# Patient Record
Sex: Female | Born: 1960 | Race: White | Hispanic: No | Marital: Married | Smoking: Current every day smoker
Health system: Southern US, Community
[De-identification: ages and names within clinical notes are randomized; demographics above are authoritative.]

## PROBLEM LIST (undated history)

## (undated) DIAGNOSIS — F319 Bipolar disorder, unspecified: Secondary | ICD-10-CM

## (undated) HISTORY — PX: ECTOPIC PREGNANCY SURGERY: SHX613

---

## 2003-04-18 ENCOUNTER — Emergency Department (HOSPITAL_COMMUNITY): Admission: EM | Admit: 2003-04-18 | Discharge: 2003-04-18 | Payer: Self-pay | Admitting: Emergency Medicine

## 2003-04-18 ENCOUNTER — Encounter: Payer: Self-pay | Admitting: Emergency Medicine

## 2004-03-29 ENCOUNTER — Emergency Department (HOSPITAL_COMMUNITY): Admission: EM | Admit: 2004-03-29 | Discharge: 2004-03-29 | Payer: Self-pay | Admitting: Emergency Medicine

## 2004-04-29 ENCOUNTER — Encounter: Admission: RE | Admit: 2004-04-29 | Discharge: 2004-04-29 | Payer: Self-pay | Admitting: Family Medicine

## 2004-06-22 ENCOUNTER — Emergency Department (HOSPITAL_COMMUNITY): Admission: EM | Admit: 2004-06-22 | Discharge: 2004-06-22 | Payer: Self-pay | Admitting: Emergency Medicine

## 2004-06-23 ENCOUNTER — Emergency Department (HOSPITAL_COMMUNITY): Admission: EM | Admit: 2004-06-23 | Discharge: 2004-06-23 | Payer: Self-pay | Admitting: *Deleted

## 2004-09-06 ENCOUNTER — Ambulatory Visit: Payer: Self-pay | Admitting: Internal Medicine

## 2004-11-27 ENCOUNTER — Ambulatory Visit: Payer: Self-pay | Admitting: Family Medicine

## 2005-02-04 ENCOUNTER — Ambulatory Visit: Payer: Self-pay | Admitting: Family Medicine

## 2005-02-10 ENCOUNTER — Ambulatory Visit: Payer: Self-pay | Admitting: Family Medicine

## 2005-02-18 ENCOUNTER — Ambulatory Visit: Payer: Self-pay | Admitting: Family Medicine

## 2005-02-24 ENCOUNTER — Ambulatory Visit: Payer: Self-pay | Admitting: Family Medicine

## 2005-03-12 ENCOUNTER — Ambulatory Visit: Payer: Self-pay | Admitting: Family Medicine

## 2005-03-27 ENCOUNTER — Ambulatory Visit: Payer: Self-pay | Admitting: Family Medicine

## 2005-04-15 ENCOUNTER — Ambulatory Visit: Payer: Self-pay | Admitting: Family Medicine

## 2005-05-20 ENCOUNTER — Ambulatory Visit: Payer: Self-pay | Admitting: Family Medicine

## 2005-10-22 ENCOUNTER — Ambulatory Visit: Payer: Self-pay | Admitting: Family Medicine

## 2006-01-06 ENCOUNTER — Ambulatory Visit: Payer: Self-pay | Admitting: Internal Medicine

## 2006-02-04 ENCOUNTER — Ambulatory Visit: Payer: Self-pay | Admitting: Family Medicine

## 2006-02-04 ENCOUNTER — Encounter (INDEPENDENT_AMBULATORY_CARE_PROVIDER_SITE_OTHER): Payer: Self-pay | Admitting: Internal Medicine

## 2006-02-04 LAB — CONVERTED CEMR LAB
Creatinine, Ser: 0.7 mg/dL
Lithium Lvl: <0.25 meq/L

## 2006-03-05 ENCOUNTER — Ambulatory Visit: Payer: Self-pay | Admitting: Family Medicine

## 2007-05-03 ENCOUNTER — Encounter (INDEPENDENT_AMBULATORY_CARE_PROVIDER_SITE_OTHER): Payer: Self-pay | Admitting: Internal Medicine

## 2007-05-03 DIAGNOSIS — J309 Allergic rhinitis, unspecified: Secondary | ICD-10-CM | POA: Insufficient documentation

## 2007-05-03 DIAGNOSIS — O009 Unspecified ectopic pregnancy without intrauterine pregnancy: Secondary | ICD-10-CM

## 2007-05-03 DIAGNOSIS — F429 Obsessive-compulsive disorder, unspecified: Secondary | ICD-10-CM | POA: Insufficient documentation

## 2007-05-03 DIAGNOSIS — G2581 Restless legs syndrome: Secondary | ICD-10-CM

## 2007-05-03 DIAGNOSIS — F519 Sleep disorder not due to a substance or known physiological condition, unspecified: Secondary | ICD-10-CM | POA: Insufficient documentation

## 2007-05-03 DIAGNOSIS — F319 Bipolar disorder, unspecified: Secondary | ICD-10-CM | POA: Insufficient documentation

## 2007-05-03 DIAGNOSIS — G43909 Migraine, unspecified, not intractable, without status migrainosus: Secondary | ICD-10-CM | POA: Insufficient documentation

## 2007-05-03 LAB — CONVERTED CEMR LAB: MCV: 92.2 fL

## 2007-05-17 ENCOUNTER — Ambulatory Visit: Payer: Self-pay | Admitting: Family Medicine

## 2007-05-17 ENCOUNTER — Encounter (INDEPENDENT_AMBULATORY_CARE_PROVIDER_SITE_OTHER): Payer: Self-pay | Admitting: Internal Medicine

## 2007-05-17 LAB — CONVERTED CEMR LAB: Valproic Acid Lvl: 95.6 ug/mL

## 2008-06-05 ENCOUNTER — Telehealth (INDEPENDENT_AMBULATORY_CARE_PROVIDER_SITE_OTHER): Payer: Self-pay | Admitting: Family Medicine

## 2008-06-06 ENCOUNTER — Ambulatory Visit: Payer: Self-pay | Admitting: Family Medicine

## 2008-06-06 DIAGNOSIS — J069 Acute upper respiratory infection, unspecified: Secondary | ICD-10-CM | POA: Insufficient documentation

## 2008-07-24 ENCOUNTER — Encounter (INDEPENDENT_AMBULATORY_CARE_PROVIDER_SITE_OTHER): Payer: Self-pay | Admitting: Internal Medicine

## 2008-11-06 ENCOUNTER — Telehealth (INDEPENDENT_AMBULATORY_CARE_PROVIDER_SITE_OTHER): Payer: Self-pay | Admitting: *Deleted

## 2008-11-13 ENCOUNTER — Ambulatory Visit: Payer: Self-pay | Admitting: Family Medicine

## 2008-11-13 DIAGNOSIS — K219 Gastro-esophageal reflux disease without esophagitis: Secondary | ICD-10-CM

## 2009-01-31 ENCOUNTER — Ambulatory Visit: Payer: Self-pay | Admitting: Family Medicine

## 2009-01-31 DIAGNOSIS — G44209 Tension-type headache, unspecified, not intractable: Secondary | ICD-10-CM

## 2009-02-06 LAB — CONVERTED CEMR LAB
ALT: 27 units/L (ref 0–35)
Alkaline Phosphatase: 80 units/L (ref 39–117)
Basophils Absolute: 0 10*3/uL (ref 0.0–0.1)
Basophils Relative: 0 % (ref 0–1)
Creatinine, Ser: 0.79 mg/dL (ref 0.40–1.20)
Eosinophils Absolute: 0.3 10*3/uL (ref 0.0–0.7)
Eosinophils Relative: 3 % (ref 0–5)
HCT: 45.2 % (ref 36.0–46.0)
Hemoglobin: 14.7 g/dL (ref 12.0–15.0)
MCHC: 32.5 g/dL (ref 30.0–36.0)
MCV: 93.4 fL (ref 78.0–100.0)
Monocytes Absolute: 0.6 10*3/uL (ref 0.1–1.0)
Platelets: 338 10*3/uL (ref 150–400)
RDW: 13.5 % (ref 11.5–15.5)
Sodium: 143 meq/L (ref 135–145)
TSH: 0.587 microintl units/mL (ref 0.350–4.50)
Total Bilirubin: 0.4 mg/dL (ref 0.3–1.2)
Total Protein: 6.9 g/dL (ref 6.0–8.3)

## 2009-02-07 ENCOUNTER — Ambulatory Visit: Payer: Self-pay | Admitting: Family Medicine

## 2009-03-07 ENCOUNTER — Encounter: Payer: Self-pay | Admitting: Obstetrics & Gynecology

## 2009-03-07 ENCOUNTER — Ambulatory Visit: Payer: Self-pay | Admitting: Obstetrics and Gynecology

## 2009-03-07 LAB — CONVERTED CEMR LAB
Chlamydia, DNA Probe: NEGATIVE
GC Probe Amp, Genital: NEGATIVE

## 2010-03-09 ENCOUNTER — Emergency Department (HOSPITAL_COMMUNITY): Admission: EM | Admit: 2010-03-09 | Discharge: 2010-03-09 | Payer: Self-pay | Admitting: Emergency Medicine

## 2010-03-27 ENCOUNTER — Ambulatory Visit: Payer: Self-pay | Admitting: Nurse Practitioner

## 2010-03-27 DIAGNOSIS — M25569 Pain in unspecified knee: Secondary | ICD-10-CM | POA: Insufficient documentation

## 2010-03-28 ENCOUNTER — Emergency Department (HOSPITAL_COMMUNITY): Admission: EM | Admit: 2010-03-28 | Discharge: 2010-03-28 | Payer: Self-pay | Admitting: Emergency Medicine

## 2010-04-04 ENCOUNTER — Ambulatory Visit: Payer: Self-pay | Admitting: Physician Assistant

## 2010-04-04 ENCOUNTER — Telehealth: Payer: Self-pay | Admitting: Physician Assistant

## 2010-04-04 DIAGNOSIS — M5380 Other specified dorsopathies, site unspecified: Secondary | ICD-10-CM | POA: Insufficient documentation

## 2010-04-26 ENCOUNTER — Encounter: Payer: Self-pay | Admitting: Physician Assistant

## 2010-05-06 ENCOUNTER — Telehealth: Payer: Self-pay | Admitting: Physician Assistant

## 2010-05-09 ENCOUNTER — Telehealth: Payer: Self-pay | Admitting: Physician Assistant

## 2010-05-15 ENCOUNTER — Ambulatory Visit: Payer: Self-pay | Admitting: Physician Assistant

## 2010-05-16 LAB — CONVERTED CEMR LAB
BUN: 9 mg/dL (ref 6–23)
Basophils Absolute: 0 10*3/uL (ref 0.0–0.1)
Basophils Relative: 0 % (ref 0–1)
Calcium: 9.2 mg/dL (ref 8.4–10.5)
Eosinophils Relative: 2 % (ref 0–5)
Glucose, Bld: 75 mg/dL (ref 70–99)
HCT: 45.9 % (ref 36.0–46.0)
Hemoglobin: 14.7 g/dL (ref 12.0–15.0)
Iron: 48 ug/dL (ref 42–145)
Lymphocytes Relative: 26 % (ref 12–46)
MCHC: 32 g/dL (ref 30.0–36.0)
Magnesium: 2.1 mg/dL (ref 1.5–2.5)
Monocytes Absolute: 0.5 10*3/uL (ref 0.1–1.0)
Neutro Abs: 7.1 10*3/uL (ref 1.7–7.7)
Platelets: 415 10*3/uL — ABNORMAL HIGH (ref 150–400)
RDW: 14.3 % (ref 11.5–15.5)
Saturation Ratios: 13 % — ABNORMAL LOW (ref 20–55)
TIBC: 382 ug/dL (ref 250–470)
TSH: 0.504 microintl units/mL (ref 0.350–4.500)

## 2010-05-21 ENCOUNTER — Encounter: Payer: Self-pay | Admitting: Physician Assistant

## 2010-05-22 ENCOUNTER — Encounter (INDEPENDENT_AMBULATORY_CARE_PROVIDER_SITE_OTHER): Payer: Self-pay | Admitting: Internal Medicine

## 2010-05-22 ENCOUNTER — Encounter: Payer: Self-pay | Admitting: Physician Assistant

## 2010-05-22 ENCOUNTER — Ambulatory Visit: Payer: Self-pay | Admitting: Vascular Surgery

## 2010-05-22 ENCOUNTER — Ambulatory Visit (HOSPITAL_COMMUNITY): Admission: RE | Admit: 2010-05-22 | Discharge: 2010-05-22 | Payer: Self-pay | Admitting: Internal Medicine

## 2010-05-29 ENCOUNTER — Ambulatory Visit: Payer: Self-pay | Admitting: Internal Medicine

## 2010-05-29 DIAGNOSIS — M79609 Pain in unspecified limb: Secondary | ICD-10-CM

## 2010-08-20 ENCOUNTER — Telehealth (INDEPENDENT_AMBULATORY_CARE_PROVIDER_SITE_OTHER): Payer: Self-pay | Admitting: *Deleted

## 2010-09-19 ENCOUNTER — Encounter: Payer: Self-pay | Admitting: Physician Assistant

## 2010-09-20 ENCOUNTER — Encounter: Payer: Self-pay | Admitting: Physician Assistant

## 2010-09-20 DIAGNOSIS — B079 Viral wart, unspecified: Secondary | ICD-10-CM | POA: Insufficient documentation

## 2010-09-23 ENCOUNTER — Encounter: Payer: Self-pay | Admitting: Physician Assistant

## 2010-12-04 ENCOUNTER — Encounter (INDEPENDENT_AMBULATORY_CARE_PROVIDER_SITE_OTHER): Payer: Self-pay | Admitting: Internal Medicine

## 2010-12-17 ENCOUNTER — Encounter (INDEPENDENT_AMBULATORY_CARE_PROVIDER_SITE_OTHER): Payer: Self-pay | Admitting: Internal Medicine

## 2011-01-28 NOTE — Assessment & Plan Note (Signed)
Summary: Acute - Knee pain   Vital Signs:  Patient profile:   50 year old female LMP:     02/2010 Height:      60.25 inches Weight:      169.4 pounds BMI:     32.93 BSA:     1.75 Temp:     97.6 degrees F oral Pulse rate:   91 / minute Pulse rhythm:   regular Resp:     20 per minute BP sitting:   119 / 86  (left arm) Cuff size:   regular  Vitals Entered By: Levon Hedger (March 27, 2010 11:21 AM) CC: pain in back of leg in knee joint and pain in mid of back...went to [place] for pain and the leg pain started about 2 days ago, Depression Is Patient Diabetic? No Pain Assessment Patient in pain? yes     Location: back of leg, back Intensity: 7-8 Type: ripping Onset of pain  Constant  Does patient need assistance? Functional Status Self care Ambulation Normal Comments pt states she is not currently taking any medication. LMP (date): 02/2010 LMP - Character: heavy     Enter LMP: 02/2010   CC:  pain in back of leg in knee joint and pain in mid of back...went to [place] for pain and the leg pain started about 2 days ago and Depression.  History of Present Illness:  Pt into the office for f/u on ER visit - March 12th, 2011 (discharge note reviewed) Pt woke up on the morning of presentation and pain was excuriating in her back and down into her legs. She reports that she was barely able to move and needed assitance to walk.  She had a limping gait. lumbar x-rays done She was given iubuprofen and pain medications (vicodin).  3 days later she was doing much better.  2 days ago she started with pain in the right lower extremity. Back pain is gone and at this time only pain in in her right posterior knee.  Left leg is ok. No redness of swelling of the knee She has been taking advil, tylenol and ibuprofen over the counter  Social - Brother died last week.  His funeral will be tomorrow.  She has been helping to get things situated for the funeral.  Obesity - gained 50  pounds over the last 3 years.  She is going to start exercise on next week.  she has a plan to increase her activity and speaks about improving her diet.    Depression History:      Positive alarm features for a manic disorder include distractibility.  She denies less need for sleep and excessive buying sprees.        Comments:  Pt was going to see Dr. Hortencia Pilar at the [facility] but she has not been in over 6 months.  She used to take geodon and seroquel.  She is not currently taking any medications.   Allergies (verified): 1)  ! Penicillin  Review of Systems MS:  Complains of joint pain; right knee pain. Psych:  Complains of mental problems; Manic.  Physical Exam  General:  alert.  obese Ears:  ear piercing(s) noted.   Msk:  negative homan's Pulses:  DP +2 Extremities:  no edema Neurologic:  alert & oriented X3.     Knee Exam  Knee Exam:    Right:    Inspection:  Normal    Palpation:  Normal    Stability:  stable  Tenderness:  no    Swelling:  no    Erythema:  no    no palpable cyst in posterior knee   Impression & Recommendations:  Problem # 1:  KNEE PAIN, RIGHT (ICD-719.46) Pain most likely from altered gait pattern during time when she had back pain May also be due to cyst in knee - will order x-ray use Ace Wrap as needed - as pt is not able to rest due to funeral services tomorrow anti-inflammatories Depomedrol and toradol given in office Her updated medication list for this problem includes:    Diclofenac Sodium 75 Mg Tbec (Diclofenac sodium) ..... One tablet by mouth two times a day as needed with food  Orders: Radiology other (Radiology Other) Depo- Medrol 40mg  (J1030) Ketorolac-Toradol 15mg  (X5400) Admin of Therapeutic Inj  intramuscular or subcutaneous (86761) Ace Wraps 3-5 in/yard  (P5093)  Problem # 2:  SYNDROME, RESTLESS LEGS (ICD-333.94) long history -  will refill requip  Complete Medication List: 1)  Zyrtec 10 Mg Tabs (Cetirizine hcl)  .... Once daily 2)  Requip 1 Mg Tabs (Ropinirole hydrochloride) .... One tablet by mouth nightly as needed  for restless legs 3)  Ambien 10 Mg Tabs (Zolpidem tartrate) .Marland Kitchen.. 1 .qhs( dr.bartlett) 4)  Promethazine-codeine 6.25-10 Mg/17ml Syrp (Promethazine-codeine) .... Take 1 teaspoon by mouth every 6 hours as needed severe cough 5)  Fluticasone Propionate 50 Mcg/act Susp (Fluticasone propionate) .... 2 sprays each nostril daily 6)  Famotidine 20 Mg Tabs (Famotidine) .... Take 1 tablet by mouth every 12 hours for irritated stomach 7)  Amantadine Hcl 100 Mg Tabs (Amantadine hcl) 8)  Diclofenac Sodium 75 Mg Tbec (Diclofenac sodium) .... One tablet by mouth two times a day as needed with food  Patient Instructions: 1)  You will get medications for your knee pain. 2)  Knee pain is likely due to limping on your right leg when your back was hurting. 3)  Take diclofenac 75mg  by mouth two times a day with food for the next 3 days then as needed. 4)  Place ace wrap on the right knee, especially since you will probably be on your feet a lot tomorrow 5)  Keep your appointment for complete physical exam Prescriptions: DICLOFENAC SODIUM 75 MG TBEC (DICLOFENAC SODIUM) One tablet by mouth two times a day as needed with food  #30 x 1   Entered and Authorized by:   Lehman Prom FNP   Signed by:   Lehman Prom FNP on 03/27/2010   Method used:   Print then Give to Patient   RxID:   [id number] REQUIP 1 MG TABS (ROPINIROLE HYDROCHLORIDE) One tablet by mouth nightly as needed  for restless legs  #30 x 0   Entered and Authorized by:   Lehman Prom FNP   Signed by:   Lehman Prom FNP on 03/27/2010   Method used:   Print then Give to Patient   RxID:   [id number]    X-ray  Procedure date:  03/09/2010  Findings:      lumbar - Mild denerative changes but no acute bony findings   Medication Administration  Injection # 1:    Medication: Depo- Medrol 40mg     Diagnosis: KNEE PAIN, RIGHT  (ICD-719.46)    Route: IM    Site: LUOQ gluteus    Exp Date: 02/2012    Lot #: [id number]    Mfr: Pharmacia    Patient tolerated injection without complications    Given by: Levon Hedger (March 27, 2010 12:30 PM)  Injection # 2:    Medication: Ketorolac-Toradol 15mg     Diagnosis: KNEE PAIN, RIGHT (ICD-719.46)    Route: IM    Site: RUOQ gluteus    Exp Date: 02/2011    Lot #: [id number]    Mfr: Perrin Maltese    Patient tolerated injection without complications    Given by: Levon Hedger (March 27, 2010 12:32 PM)  Orders Added: 1)  Est. Patient Level III [99213] 2)  Radiology other [Radiology Other] 3)  Depo- Medrol 40mg  [J1030] 4)  Ketorolac-Toradol 15mg  [J1885] 5)  Admin of Therapeutic Inj  intramuscular or subcutaneous [96372] 6)  Ace Wraps 3-5 in/yard  [N0272]

## 2011-01-28 NOTE — Letter (Signed)
Summary: Letter//NO LONGER REPRESENTING   Letter//NO LONGER REPRESENTING   Imported By: Arta Bruce 04/26/2010 11:43:15  _____________________________________________________________________  External Attachment:    Type:   Image     Comment:   External Document

## 2011-01-28 NOTE — Assessment & Plan Note (Signed)
Summary: Patient never seen . . . rescheduled   Vital Signs:  Patient profile:   50 year old female Height:      60.25 inches  Vitals Entered By: Armenia Shannon (May 21, 2010 12:00 PM)  Allergies: 1)  ! Penicillin   Complete Medication List: 1)  Requip 2 Mg Tabs (Ropinirole hcl) .... Take one by mouth at bedtime as needed for restless legs (pharmacy note dose change) 2)  Diclofenac Sodium 75 Mg Tbec (Diclofenac sodium) .... One tablet by mouth two times a day as needed with food 3)  Percocet 5-325 Mg Tabs (Oxycodone-acetaminophen) .Marland Kitchen.. 1-2 tabs by mouth every 8 hours as needed for severe pain 4)  Flexeril 10 Mg Tabs (Cyclobenzaprine hcl) .... Take 1 tablet by mouth three times a day as needed for muscle spasm 5)  Ferrous Sulfate 325 (65 Fe) Mg Tabs (Ferrous sulfate) .... Take 1 tablet by mouth two times a day

## 2011-01-28 NOTE — Progress Notes (Signed)
Summary: LEG REALLY REALLY HURTS  Phone Note Call from Patient Call back at Home Phone [phone]   Summary of Call: Kimberly Henson PT. MS Lamora CALLED AND SAYS THAT HER LEG IS REALLY BOTHERING HER AND THE CORTISONE SHOT IS NOT HELPING AND SHE WANTS TO KNOW IF SHE CAN GET HER PERCOCET REFILLED. SHE USES [facility] THEIR # IS [id number].  SHE SAYS THE PAIN IS UNBAREABLE, AND SHE DOESN'T COME IN UNTIL NEXT WEDNESDAY. Initial call taken by: Leodis Rains,  May 09, 2010 10:29 AM  Follow-up for Phone Call        MADE REMINDER CALL FOR DAUGHER //[name] SAIDS SHE NEEDS SOMRTHING FOR PAIN REALLY REALLY BAD TODAY Follow-up by: Arta Bruce,  May 09, 2010 2:45 PM  Additional Follow-up for Phone Call Additional follow up Details #1::        forward to provider, perocet last filled on 04-04-10 Additional Follow-up by: Armenia Shannon,  May 09, 2010 3:10 PM    Additional Follow-up for Phone Call Additional follow up Details #2::    [name] CALLED AND SAYS THAT SHE IS IN REALLY BAD PAIN AND SHE HATES TO GO SIT IN THE ER. SHE WANTS TO KNOW IF SH CAN GET JUST ENOUGH TILL HER APPT HERE WED.Cala Bradford Tinnin  May 10, 2010 10:07 AM   Rx in basket for her to pick up. Keep appt next week. Follow-up by: Tereso Newcomer PA-C,  May 10, 2010 3:15 PM  Additional Follow-up for Phone Call Additional follow up Details #3:: Details for Additional Follow-up Action Taken: The pt came and got the prescription.[name]  May 10, 2010 4:44 PM  Prescriptions: PERCOCET 5-325 MG TABS (OXYCODONE-ACETAMINOPHEN) 1-2 tabs by mouth every 8 hours as needed for severe pain  #40 x 0   Entered and Authorized by:   Tereso Newcomer PA-C   Signed by:   Tereso Newcomer PA-C on 05/10/2010   Method used:   Print then Give to Patient   RxID:   [id number]

## 2011-01-28 NOTE — Letter (Signed)
Summary: TEST ORDER FORM/ULTRASOUND//APPT DATE & TIME  TEST ORDER FORM/ULTRASOUND//APPT DATE & TIME   Imported By: Arta Bruce 05/15/2010 14:05:32  _____________________________________________________________________  External Attachment:    Type:   Image     Comment:   External Document
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
## 2011-01-28 NOTE — Progress Notes (Signed)
Summary: Requesting letter  Phone Note Call from Patient Call back at Home Phone [phone]   Summary of Call: The pt needs the provider to write down a letter stating that shows her most current diagnosed in which establish that cannot do strong walk and cannot leaf heavy amount of wt.  Alben Spittle PA-c  Initial call taken by: [name],  May 06, 2010 8:37 AM  Follow-up for Phone Call        MS Deshmukh CALLED BACK AND WANTS TO KNOW IF SHE CAN GET A REFERRAL TO SEE A DERMATOLOGIST AT DR. Elease Etienne OFFICE, FOR A PIMPLE THAT HAS BEEN ON HER CHIN FOR ABOUT 2 MONTHS AND WANT GO AWAY. SHE SAYS SHE CALLED THEM, BUT THEY NEED A REFERRAL. Follow-up by: Leodis Rains,  May 06, 2010 8:53 AM  Additional Follow-up for Phone Call Additional follow up Details #1::        spoke with pt and she says her job gives her heavy work and she hurt her leg so she is unable to strong work... pt says she just wants to be put on light duty at the job.....Marland Kitchen pt says she has a pimple under her chin for two months and its hurts her and she would like to see a dermatologist for this to see what it is.. Armenia Shannon  May 06, 2010 9:10 AM     Additional Follow-up for Phone Call Additional follow up Details #2::    needs appointment Follow-up by: Brynda Rim,  May 06, 2010 12:47 PM  Additional Follow-up for Phone Call Additional follow up Details #3:: Details for Additional Follow-up Action Taken: Pt will come next Wednesday, May 15, 2010 at 11:45 am.Graciela Kellar  May 07, 2010 11:42 AM

## 2011-01-28 NOTE — Letter (Signed)
Summary: DISCHARGE LETTER MAILED  DISCHARGE LETTER MAILED   Imported By: Arta Bruce 09/23/2010 10:39:17  _____________________________________________________________________  External Attachment:    Type:   Image     Comment:   External Document

## 2011-01-28 NOTE — Letter (Signed)
Summary: Work Excuse  [facility]  [address] [place], [place] [address]   Phone: [phone]  Fax: [phone]    Today's Date: May 15, 2010  Name of Patient: Kimberly Henson  The above named patient had a medical visit today at:  11:45 am / pm.  Please take this into consideration when reviewing the time away from work/school.    Special Instructions:  [  ] None  [  ] To be off the remainder of today, returning to the normal work / school schedule tomorrow.  [  ] To be off until the next scheduled appointment on ______________________.  [ x ] Other -- Please allow patient to do light duty for 2 weeks (no prolonged standing, squatting, walking).  Sincerely yours,   Tereso Newcomer PA-C

## 2011-01-28 NOTE — Letter (Signed)
Summary: [facility]  [facility]   Imported By: Arta Bruce 06/10/2010 11:54:35  _____________________________________________________________________  External Attachment:    Type:   Image     Comment:   External Document

## 2011-01-28 NOTE — Progress Notes (Signed)
Summary: wants referral  Phone Note Call from Patient   Summary of Call: Pt is very, very upset, she feels like suing [place] because she was not given a referral for a dermatologist and was told instead to get the otc freeze wart stuff to use on her face and now her face is a 100x worse and she looks terrible and is in a lot of pain.  The "wart" started off the size of a lady bug and is now the size of silver dollar.  She would now like a referral again for this issues.  Pt can be reached at [phone] Initial call taken by: Vesta Mixer CMA,  August 20, 2010 11:07 AM  Follow-up for Phone Call        Dr. Delrae Alfred requested Referral on 06.01.11 for Dr. Terri Piedra. No documentation that it had been done. Forwarded to Arna Medici to schedule ASAP. Gaylyn Cheers RN  August 20, 2010 11:52 AM   Additional Follow-up for Phone Call Additional follow up Details #1::        I Call [facility] and I made her an appt 08-20-10  @ 2:00PM Leave a message in answer machine to call me back  Additional Follow-up by: Cheryll Dessert,  August 20, 2010 12:01 PM    Additional Follow-up for Phone Call Additional follow up Details #2::    Pt nos to her appt today . call Left message with  son   for pt to return call.Cheryll Dessert  August 20, 2010 2:32 PM NEW APPT  09-19-10 @ 9:20 AM Left message  for pt to return call. I Spoke to pt today she is aware of her appt        Follow-up by: Cheryll Dessert,  August 21, 2010 9:15 AM

## 2011-01-28 NOTE — Letter (Signed)
Summary: DR Gelene Mink LUPTOM//DID NOT KEEP APPT  DR Gelene Mink LUPTOM//DID NOT KEEP APPT   Imported By: Arta Bruce 09/30/2010 12:46:26  _____________________________________________________________________  External Attachment:    Type:   Image     Comment:   External Document

## 2011-01-28 NOTE — Letter (Signed)
Summary: Handout Printed  Printed Handout:  - Restless Legs Syndrome

## 2011-01-28 NOTE — Miscellaneous (Signed)
  Clinical Lists Changes  Problems: Changed problem from WART (ICD-078.10) to WART (ICD-078.10) - referred to derm; 2 appts made; no showed to both of them

## 2011-01-28 NOTE — Assessment & Plan Note (Signed)
Summary: office visit/ referral and letter request//gk   Vital Signs:  Patient profile:   50 year old female Height:      60.25 inches Weight:      159 pounds BMI:     30.91 Temp:     97.8 degrees F oral Pulse rate:   77 / minute Pulse rhythm:   regular Resp:     18 per minute BP sitting:   106 / 73  (left arm) Cuff size:   regular  Vitals Entered By: [place] Shannon (May 15, 2010 11:49 AM) CC: pt is here for leg pain.. pt says she hate laying down because of the pain.. pt says she can not go up or down stairs... pt says she wants to do physical therapy. Is Patient Diabetic? No Pain Assessment Patient in pain? no       Does patient need assistance? Functional Status Self care Ambulation Normal   Primary Care Provider:  Tereso Newcomer PA-C  CC:  pt is here for leg pain.. pt says she hate laying down because of the pain.. pt says she can not go up or down stairs... pt says she wants to do physical therapy.Marland Kitchen  History of Present Illness: Here for knee pain and restless leg syndrome.  Knee pain:  Right knee.  Started in March 2011.  Dorma Russell, NP several weeks ago.  Rec'd steroid injection in office which helped.  No injury to knee.  No locking or instability.  No swelling.  Hurts with knee extension and going up and down steps.  She has not done anything else for it.  Called here several times last week with complaints of severe pain.  Percocet refilled for her because she could not get in to see [place] until today.  Restless Leg Syndrome:  Prolonged problem.  Dorma Russell, NP several weeks ago and had Requip restarted.  Feels like something crawling on her legs.  Has to constantly move.  Took Advil PM one night . . made it worse.  She has taken more Requip on her own with some relief.  Bipolar D/O:  Sees counselor at [facility] [employer].  Says she is going to be restarted on Geodon.  Current Medications (verified): 1)  Zyrtec 10 Mg Tabs (Cetirizine Hcl) .... Once Daily 2)  Requip 1  Mg Tabs (Ropinirole Hydrochloride) .... One Tablet By Mouth Nightly As Needed  For Restless Legs 3)  Ambien 10 Mg  Tabs (Zolpidem Tartrate) .Marland Kitchen.. 1 .qhs( Dr.bartlett) 4)  Promethazine-Codeine 6.25-10 Mg/61ml  Syrp (Promethazine-Codeine) .... Take 1 Teaspoon By Mouth Every 6 Hours As Needed Severe Cough 5)  Fluticasone Propionate 50 Mcg/act  Susp (Fluticasone Propionate) .... 2 Sprays Each Nostril Daily 6)  Famotidine 20 Mg Tabs (Famotidine) .... Take 1 Tablet By Mouth Every 12 Hours For Irritated Stomach 7)  Amantadine Hcl 100 Mg Tabs (Amantadine Hcl) 8)  Diclofenac Sodium 75 Mg Tbec (Diclofenac Sodium) .... One Tablet By Mouth Two Times A Day As Needed With Food 9)  Prednisone 10 Mg Tabs (Prednisone) .... Take 6 Tabs Today, 5 Tabs On Fri, 4 Tabs On Sat, 3 Tabs On Sun, 2 Tabs On Mon, 1 Tab On Tues, Then Stop. 10)  Percocet 5-325 Mg Tabs (Oxycodone-Acetaminophen) .Marland Kitchen.. 1-2 Tabs By Mouth Every 8 Hours As Needed For Severe Pain 11)  Flexeril 10 Mg Tabs (Cyclobenzaprine Hcl) .... Take 1 Tablet By Mouth Three Times A Day As Needed For Muscle Spasm  Allergies (verified): 1)  ! Penicillin  Physical Exam  General:  alert, well-developed, and well-nourished.   Head:  normocephalic and atraumatic.   Lungs:  normal breath sounds.   Heart:  normal rate and regular rhythm.   Msk:  right knee: no eff neg ant drawer neg McMurray no crepitus no palpable abnormality in popliteal space Extremities:  no edema bilat  Neurologic:  alert & oriented X3 and cranial nerves II-XII intact.   Psych:  normally interactive.     Impression & Recommendations:  Problem # 1:  KNEE PAIN, RIGHT (ICD-719.46)  ? Baker's Cyst  . . . get ultrasound alt with heat and ice refill diclofenac warned her about dangers of using percocet if u/s neg consider referral to PT  Her updated medication list for this problem includes:    Diclofenac Sodium 75 Mg Tbec (Diclofenac sodium) ..... One tablet by mouth two times a day as  needed with food    Percocet 5-325 Mg Tabs (Oxycodone-acetaminophen) .Marland Kitchen... 1-2 tabs by mouth every 8 hours as needed for severe pain    Flexeril 10 Mg Tabs (Cyclobenzaprine hcl) .Marland Kitchen... Take 1 tablet by mouth three times a day as needed for muscle spasm  Orders: Ultrasound (Ultrasound)  Problem # 2:  DSORD BIPOLAR I, UNSPC, MOST RECENT EPSD (ICD-296.7) states she is going to counseling states she is to restart Geodon when she sees Dr. Ardelle Anton at the [facility]  Problem # 3:  SYNDROME, RESTLESS LEGS (ICD-333.94) check labs to r/o other causes increase Requip to 2 mg at bedtime as needed (she has already been taking) consider adding Gabapentin vs Klonopin vs clonidine if needed  Orders: T-Basic Metabolic Panel [phone]) T-CBC w/Diff [phone]) T-Folic Acid; RBC [id number]) T-TSH [id number]) T-Ferritin [phone]) T-Iron [phone]) T-Iron Binding Capacity (TIBC) ([id number]) T-Magnesium ([id number])  Complete Medication List: 1)  Requip 2 Mg Tabs (Ropinirole hcl) .... Take one by mouth at bedtime as needed for restless legs (pharmacy note dose change) 2)  Diclofenac Sodium 75 Mg Tbec (Diclofenac sodium) .... One tablet by mouth two times a day as needed with food 3)  Percocet 5-325 Mg Tabs (Oxycodone-acetaminophen) .Marland Kitchen.. 1-2 tabs by mouth every 8 hours as needed for severe pain 4)  Flexeril 10 Mg Tabs (Cyclobenzaprine hcl) .... Take 1 tablet by mouth three times a day as needed for muscle spasm  Patient Instructions: 1)  Avoid alcohol and smoking as this can make restless legs worse. 2)  Avoid exercise or sex within 2-3 hours of bed as this can make restless legs worse. 3)  Use the Diclofenac two times a day with food for 3-5 days.  Then, take two times a day as needed for pain. 4)  Use tylenol in between as needed. 5)  Take 650 - 1000 mg of tylenol every 4-6 hours as needed for relief of pain or comfort of fever. Avoid taking more than 4000 mg in a 24 hour period( can  cause liver damage in higher doses).  6)  Use percocet as a last resort.  It has tylenol in it, so do not take with tylenol. 7)  Apply heat then ice to your knee several times a day for 1-2 weeks. 8)  Reschedule your CPP with Caelin Rosen. Prescriptions: DICLOFENAC SODIUM 75 MG TBEC (DICLOFENAC SODIUM) One tablet by mouth two times a day as needed with food  #30 x 1   Entered and Authorized by:   Tereso Newcomer PA-C   Signed by:   Tereso Newcomer PA-C on 05/15/2010   Method used:  Print then Give to Patient   RxID:   [id number] REQUIP 2 MG TABS (ROPINIROLE HCL) Take one by mouth at bedtime as needed for restless legs (pharmacy note dose change)  #30 x 2   Entered and Authorized by:   Tereso Newcomer PA-C   Signed by:   Tereso Newcomer PA-C on 05/15/2010   Method used:   Print then Give to Patient   RxID:   [id number]
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
## 2011-01-28 NOTE — Assessment & Plan Note (Signed)
Summary: [facility] PT/ REFERRAL TO DERMATOLOGY/ SKIN MOLE//GK   Vital Signs:  Patient profile:   50 year old female Weight:      162 pounds Temp:     98.0 degrees F Pulse rate:   75 / minute Pulse rhythm:   regular Resp:     18 per minute BP sitting:   127 / 86  (left arm) Cuff size:   regular  Vitals Entered By: Vesta Mixer CMA (May 29, 2010 8:42 AM) CC: wants referral for derm for a growth below her chin and to a vein specialist also.  Would also like refill of her pain med last got #40 on 05/10/10 Is Patient Diabetic? No  Does patient need assistance? Ambulation Normal   Primary Care Provider:  Tereso Newcomer PA-C  CC:  wants referral for derm for a growth below her chin and to a vein specialist also.  Would also like refill of her pain med last got #40 on 05/10/10.  History of Present Illness: 1.  Lesion under chin:  has had for 3 months and seems to be getting harder and bigger.  Some tenderness.  No injury to the area.  Thought it was a pimple and tried to pop.  Keeps scratching at it.   2.  Leg pain, mainly focusing on popliteal area of left leg--no Baker's Cyst of DVT on recent venous dopplers.  Shared info with pt.  She shows me an area of lateral popliteal area with significant spider veins, but do not see larger varicosities to much extent there.   Allergies: 1)  ! Penicillin  Physical Exam  Skin:  Raised skin colored lesion 1/2 cm with heavy scab overlying.  Mild erythema of bas--appears to have been recently picked at.     Impression & Recommendations:  Problem # 1:  WART (ICD-078.10)  Appears to be a wart, but hard to say as it has been picked at quite a bit. Referral to Dr. Terri Piedra per pt request--suspect will just need liquid nitrogen treatment  Orders: Dermatology Referral (Derma)  Problem # 2:  LEG PAIN (ICD-729.5) Pt. is being evaluated for this with her primary--already has a follow up scheduled. Pt. also asked for a refill of her pain medication.   Was just filled 5/13 for 40 tabs--discussed she needed to wait until 6/13 at least.  Complete Medication List: 1)  Requip 2 Mg Tabs (Ropinirole hcl) .... Take one by mouth at bedtime as needed for restless legs (pharmacy note dose change) 2)  Diclofenac Sodium 75 Mg Tbec (Diclofenac sodium) .... One tablet by mouth two times a day as needed with food 3)  Percocet 5-325 Mg Tabs (Oxycodone-acetaminophen) .Marland Kitchen.. 1-2 tabs by mouth every 8 hours as needed for severe pain 4)  Flexeril 10 Mg Tabs (Cyclobenzaprine hcl) .... Take 1 tablet by mouth three times a day as needed for muscle spasm 5)  Ferrous Sulfate 325 (65 Fe) Mg Tabs (Ferrous sulfate) .... Take 1 tablet by mouth two times a day  Patient Instructions: 1)  Follow up with Tereso Newcomer as already scheduled for leg pain

## 2011-01-30 NOTE — Letter (Signed)
[facility]: [facility]   Imported By: Arta Bruce 12/31/2010 16:15:27  _____________________________________________________________________  External Attachment:    Type:   Image     Comment:   External Document

## 2011-01-30 NOTE — Miscellaneous (Signed)
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 Lupton12/7/11
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
## 2011-10-28 ENCOUNTER — Emergency Department (HOSPITAL_COMMUNITY)
Admission: EM | Admit: 2011-10-28 | Discharge: 2011-10-28 | Disposition: A | Discharge status: Home / Self Care | Payer: Medicaid Other | Attending: Emergency Medicine | Admitting: Emergency Medicine

## 2011-10-28 DIAGNOSIS — K089 Disorder of teeth and supporting structures, unspecified: Secondary | ICD-10-CM | POA: Insufficient documentation

## 2011-10-28 DIAGNOSIS — K029 Dental caries, unspecified: Secondary | ICD-10-CM | POA: Insufficient documentation

## 2011-11-26 ENCOUNTER — Other Ambulatory Visit: Payer: Self-pay | Admitting: Family Medicine

## 2011-11-26 DIAGNOSIS — Z1231 Encounter for screening mammogram for malignant neoplasm of breast: Secondary | ICD-10-CM

## 2011-11-27 ENCOUNTER — Encounter: Payer: Self-pay | Admitting: Emergency Medicine

## 2011-11-27 ENCOUNTER — Emergency Department (HOSPITAL_COMMUNITY)
Admission: EM | Admit: 2011-11-27 | Discharge: 2011-11-27 | Disposition: A | Discharge status: Home / Self Care | Payer: Medicaid Other | Attending: Emergency Medicine | Admitting: Emergency Medicine

## 2011-11-27 DIAGNOSIS — K089 Disorder of teeth and supporting structures, unspecified: Secondary | ICD-10-CM | POA: Insufficient documentation

## 2011-11-27 DIAGNOSIS — K0889 Other specified disorders of teeth and supporting structures: Secondary | ICD-10-CM

## 2011-11-27 DIAGNOSIS — F17200 Nicotine dependence, unspecified, uncomplicated: Secondary | ICD-10-CM | POA: Insufficient documentation

## 2011-11-27 MED ORDER — HYDROCODONE-ACETAMINOPHEN 5-325 MG PO TABS: 1.0000 | ORAL_TABLET | ORAL | Status: AC | PRN

## 2011-11-27 MED ORDER — OXYCODONE-ACETAMINOPHEN 5-325 MG PO TABS
1.0000 | ORAL_TABLET | Freq: Once | ORAL | Status: AC
  Administered 2011-11-27: 1 via ORAL
  Filled 2011-11-27: qty 1

## 2011-11-27 NOTE — ED Notes (Signed)
PT. REPORTS LEFT UPPER MOLAR PAIN FOR 2 WEEKS WORSE LAST NIGHT UNRELIEVED BY OTC PAIN MEDICATIONS . DENTAL APPOINTMENT NEXT WEEK.

## 2011-11-27 NOTE — ED Provider Notes (Signed)
History     CSN: [id number] Arrival date & time: 11/27/2011  6:55 PM   First MD Initiated Contact with Patient 11/27/11 2202      Chief Complaint  Patient presents with  . Dental Pain    (Consider location/radiation/quality/duration/timing/severity/associated sxs/prior treatment) Patient is a 50 y.o. female presenting with tooth pain. The history is provided by the patient.  Dental PainThe primary symptoms include mouth pain. Primary symptoms do not include cough. The symptoms began more than 1 month ago.  Additional symptoms do not include: drooling.   patient presents with pain in her left upper mouth. She states his been going for a month. She was seen in the ER for the same approximately month ago. She seen a dentist for tooth. She states that the dentist she sought referred her to an oral surgeon but would not not knock her out to get her tooth pulled. She states she had to go to see a different dentist because of this. She said this new dentist who referred her to another dentist who will take it out. She states that her primary care Dr. who would not give her pain medicines. No fevers.  History reviewed. No pertinent past medical history.  Past Surgical History  Procedure Date  . Ectopic pregnancy surgery     No family history on file.  History  Substance Use Topics  . Smoking status: Current Everyday Smoker  . Smokeless tobacco: Not on file  . Alcohol Use: No    OB History    Grav Para Term Preterm Abortions TAB SAB Ect Mult Living                  Review of Systems  Constitutional: Negative for chills.  HENT: Positive for dental problem. Negative for drooling.   Respiratory: Negative for cough.   Cardiovascular: Negative for chest pain.  Gastrointestinal: Negative for abdominal pain.    Allergies  Penicillins  Home Medications   Current Outpatient Rx  Name Route Sig Dispense Refill  . IBUPROFEN 200 MG PO TABS Oral Take 200 mg by mouth every 6 (six)  hours as needed. For pain     . HYDROCODONE-ACETAMINOPHEN 5-325 MG PO TABS Oral Take 1 tablet by mouth every 4 (four) hours as needed for pain. 15 tablet 0    There were no vitals taken for this visit.  Physical Exam  Nursing note and vitals reviewed. Constitutional: She is oriented to person, place, and time. She appears well-developed and well-nourished.  HENT:  Head: Normocephalic and atraumatic.       Poor dentition. Left upper 6 tooth from midline has a large cavity. It is tender. Left fifth tooth from midline is missing. No swelling of jaw. No fluctuance.  Eyes: EOM are normal. Pupils are equal, round, and reactive to light.  Neck: Normal range of motion. Neck supple.  Cardiovascular: Normal rate, regular rhythm and normal heart sounds.   No murmur heard. Pulmonary/Chest: No respiratory distress. She has no wheezes. She has no rales.  Abdominal: She exhibits no distension. There is no tenderness. There is no rebound and no guarding.  Musculoskeletal: Normal range of motion.  Lymphadenopathy:    She has no cervical adenopathy.  Neurological: She is alert and oriented to person, place, and time. No cranial nerve deficit.  Skin: Skin is warm and dry.  Psychiatric: She has a normal mood and affect. Her speech is normal.    ED Course  Procedures (including critical care time)  Labs  Reviewed - No data to display No results found.   1. Pain, dental       MDM  Dental pain with bad tooth. He does not appear to be infection right now. Patient given pain medicine. She states she has a followup on Monday.        Juliet Rude. Rubin Payor, MD 11/27/11 [phone]

## 2011-12-26 ENCOUNTER — Ambulatory Visit: Payer: Medicaid Other

## 2012-02-16 ENCOUNTER — Ambulatory Visit (HOSPITAL_COMMUNITY): Payer: Medicaid Other | Admitting: Psychiatry

## 2012-09-17 ENCOUNTER — Encounter (HOSPITAL_COMMUNITY): Payer: Self-pay | Admitting: Emergency Medicine

## 2012-09-17 ENCOUNTER — Emergency Department (HOSPITAL_COMMUNITY): Payer: Medicaid Other

## 2012-09-17 ENCOUNTER — Emergency Department (HOSPITAL_COMMUNITY)
Admission: EM | Admit: 2012-09-17 | Discharge: 2012-09-17 | Disposition: A | Discharge status: Home / Self Care | Payer: Medicaid Other | Attending: Emergency Medicine | Admitting: Emergency Medicine

## 2012-09-17 DIAGNOSIS — X500XXA Overexertion from strenuous movement or load, initial encounter: Secondary | ICD-10-CM | POA: Insufficient documentation

## 2012-09-17 DIAGNOSIS — S43499A Other sprain of unspecified shoulder joint, initial encounter: Secondary | ICD-10-CM | POA: Insufficient documentation

## 2012-09-17 DIAGNOSIS — S46911A Strain of unspecified muscle, fascia and tendon at shoulder and upper arm level, right arm, initial encounter: Secondary | ICD-10-CM

## 2012-09-17 DIAGNOSIS — Z880 Allergy status to penicillin: Secondary | ICD-10-CM | POA: Insufficient documentation

## 2012-09-17 DIAGNOSIS — F17200 Nicotine dependence, unspecified, uncomplicated: Secondary | ICD-10-CM | POA: Insufficient documentation

## 2012-09-17 MED ORDER — HYDROMORPHONE HCL PF 1 MG/ML IJ SOLN
1.0000 mg | Freq: Once | INTRAMUSCULAR | Status: AC
  Administered 2012-09-17: 1 mg via INTRAMUSCULAR
  Filled 2012-09-17: qty 1

## 2012-09-17 MED ORDER — OXYCODONE-ACETAMINOPHEN 5-325 MG PO TABS: 1.0000 | ORAL_TABLET | Freq: Four times a day (QID) | ORAL | Status: DC | PRN

## 2012-09-17 NOTE — ED Notes (Signed)
Pt returned from xray
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
## 2012-09-17 NOTE — Progress Notes (Signed)
Orthopedic Tech Progress Note Patient Details:  Kimberly Henson 07/13/1961 [id number] Arm sling applied to Right shoulder.  Ortho Devices Type of Ortho Device: Arm foam sling Ortho Device/Splint Location: Applied to Right UE Ortho Device/Splint Interventions: Application   Asia R Thompson 09/17/2012, 12:06 PM

## 2012-09-17 NOTE — ED Notes (Signed)
Pt c/o right upper arm and shoulder pain after fall yesterday; CMS intact

## 2012-12-25 ENCOUNTER — Emergency Department (HOSPITAL_COMMUNITY)
Admission: EM | Admit: 2012-12-25 | Discharge: 2012-12-25 | Disposition: A | Discharge status: Home / Self Care | Payer: Medicaid Other | Attending: Emergency Medicine | Admitting: Emergency Medicine

## 2012-12-25 ENCOUNTER — Encounter (HOSPITAL_COMMUNITY): Payer: Self-pay | Admitting: *Deleted

## 2012-12-25 DIAGNOSIS — M25519 Pain in unspecified shoulder: Secondary | ICD-10-CM | POA: Insufficient documentation

## 2012-12-25 DIAGNOSIS — F17200 Nicotine dependence, unspecified, uncomplicated: Secondary | ICD-10-CM | POA: Insufficient documentation

## 2012-12-25 DIAGNOSIS — M25511 Pain in right shoulder: Secondary | ICD-10-CM

## 2012-12-25 MED ORDER — OXYCODONE-ACETAMINOPHEN 5-325 MG PO TABS
1.0000 | ORAL_TABLET | Freq: Once | ORAL | Status: AC
  Administered 2012-12-25: 1 via ORAL
  Filled 2012-12-25: qty 1

## 2012-12-25 MED ORDER — OXYCODONE-ACETAMINOPHEN 5-325 MG PO TABS: 1.0000 | ORAL_TABLET | ORAL | Status: DC | PRN

## 2012-12-25 NOTE — ED Notes (Signed)
Reports chronic right shoulder pain, reports lifting laundry recently and having increase in pain. No acute distress noted at triage, able to move right arm.

## 2012-12-25 NOTE — ED Provider Notes (Signed)
History     CSN: [id number]  Arrival date & time 12/25/12  0908   First MD Initiated Contact with Patient 12/25/12 [phone]      Chief Complaint  Patient presents with  . Shoulder Pain    (Consider location/radiation/quality/duration/timing/severity/associated sxs/prior treatment) Patient is a 51 y.o. female presenting with shoulder pain. The history is provided by the patient.  Shoulder Pain This is a recurrent problem. Pertinent negatives include no chills or fever. Associated symptoms comments: Right shoulder pain on and off for the past several weeks, recurring again yesterday without new injury. She was seen by the orthopedic doctor yesterday and scheduled for MRI in the next week. She states she was told to take Tylenol for pain, which helped until last night when the pain became severe.Marland Kitchen    History reviewed. No pertinent past medical history.  Past Surgical History  Procedure Date  . Ectopic pregnancy surgery     History reviewed. No pertinent family history.  History  Substance Use Topics  . Smoking status: Current Every Day Smoker  . Smokeless tobacco: Not on file  . Alcohol Use: No    OB History    Grav Para Term Preterm Abortions TAB SAB Ect Mult Living                  Review of Systems  Constitutional: Negative for fever and chills.  HENT: Negative.   Respiratory: Negative.   Cardiovascular: Negative.   Gastrointestinal: Negative.   Musculoskeletal:       See HPI.  Skin: Negative.   Neurological: Negative.     Allergies  Penicillins  Home Medications   Current Outpatient Rx  Name  Route  Sig  Dispense  Refill  . IBUPROFEN 200 MG PO TABS   Oral   Take 800-1,000 mg by mouth 2 (two) times daily as needed. For pain         . NAPROXEN SODIUM 220 MG PO TABS   Oral   Take 440-660 mg by mouth 2 (two) times daily as needed. For pain           BP 136/95  Pulse 68  Temp 97.7 F (36.5 C) (Oral)  Resp 18  SpO2 96%  Physical Exam    Constitutional: She is oriented to person, place, and time. She appears well-developed and well-nourished.  Neck: Normal range of motion.  Cardiovascular:       Pulses in distal UE's 2+ and equal.  Pulmonary/Chest: Effort normal.  Musculoskeletal:       Right shoulder with FROM with pain. No swelling. No point tenderness. No midline or paracervical tenderness. 5/5 grip strength bilateral UE's.   Neurological: She is alert and oriented to person, place, and time.       Sensory exam in right UE without deficit.  Skin: Skin is warm and dry.    ED Course  Procedures (including critical care time)  Labs Reviewed - No data to display No results found.   No diagnosis found.  1. Right shoulder pain   MDM  Uncomplicated muscular shoulder pain, already under orthopedic care.        Rodena Medin, PA-C 12/25/12 1010

## 2012-12-26 NOTE — ED Provider Notes (Signed)
Medical screening examination/treatment/procedure(s) were performed by non-physician practitioner and as supervising physician I was immediately available for consultation/collaboration.   Tyreisha Ungar L Blasa Raisch, MD 12/26/12 0732 

## 2012-12-31 ENCOUNTER — Emergency Department (HOSPITAL_COMMUNITY)
Admission: EM | Admit: 2012-12-31 | Discharge: 2012-12-31 | Disposition: A | Discharge status: Home / Self Care | Payer: Medicaid Other | Attending: Emergency Medicine | Admitting: Emergency Medicine

## 2012-12-31 DIAGNOSIS — M25519 Pain in unspecified shoulder: Secondary | ICD-10-CM

## 2012-12-31 DIAGNOSIS — F17200 Nicotine dependence, unspecified, uncomplicated: Secondary | ICD-10-CM | POA: Insufficient documentation

## 2012-12-31 MED ORDER — OXYCODONE-ACETAMINOPHEN 5-325 MG PO TABS
2.0000 | ORAL_TABLET | Freq: Once | ORAL | Status: AC
  Administered 2012-12-31: 2 via ORAL
  Filled 2012-12-31: qty 2

## 2012-12-31 NOTE — ED Provider Notes (Signed)
History     CSN: [id number]  Arrival date & time 12/31/12  0903   First MD Initiated Contact with Patient 12/31/12 [phone]      Chief Complaint  Patient presents with  . Shoulder Pain    (Consider location/radiation/quality/duration/timing/severity/associated sxs/prior treatment) HPI Pt presents with c/o continued right shoulder pain.  She has had the pain in her right shoulder for several months.  She is scheduled to have an MRI tomorrow and is under the care of an orthopedic physician.  She states he told her to take over the counter pain medications.  She has been seen in the ED and was given percocet last week.  She states pain has returned and is worse with movement and certain positions.  No swelling of arm.  There are no other associated systemic symptoms, there are no other alleviating or modifying factors.   No past medical history on file.  Past Surgical History  Procedure Date  . Ectopic pregnancy surgery     No family history on file.  History  Substance Use Topics  . Smoking status: Current Every Day Smoker  . Smokeless tobacco: Not on file  . Alcohol Use: No    OB History    Grav Para Term Preterm Abortions TAB SAB Ect Mult Living                  Review of Systems ROS reviewed and all otherwise negative except for mentioned in HPI  Allergies  Penicillins  Home Medications   Current Outpatient Rx  Name  Route  Sig  Dispense  Refill  . IBUPROFEN 200 MG PO TABS   Oral   Take 800-1,000 mg by mouth 2 (two) times daily as needed. For pain         . NAPROXEN SODIUM 220 MG PO TABS   Oral   Take 440-660 mg by mouth 2 (two) times daily as needed. For pain         . OXYCODONE-ACETAMINOPHEN 5-325 MG PO TABS   Oral   Take 1-2 tablets by mouth every 4 (four) hours as needed for pain.   20 tablet   0     BP 133/75  Pulse 69  Temp 98 F (36.7 C) (Oral)  Resp 16  Ht 5\' 2"  (1.575 m)  Wt 125 lb (56.7 kg)  BMI 22.86 kg/m2  SpO2 98% Vitals  reviewed Physical Exam Physical Examination: General appearance - alert, well appearing, and in no distress Mental status - alert, oriented to person, place, and time Chest - clear to auscultation, no wheezes, rales or rhonchi, symmetric air entry Neurological - alert, oriented, normal speech, strength 5/5 in upper extremities Musculoskeletal - ttp over right anterior shoulder, decreased ROM due to pain Extremities - peripheral pulses normal, no pedal edema, no clubbing or cyanosis Skin - normal coloration and turgor, no rashes  ED Course  Procedures (including critical care time)  Labs Reviewed - No data to display No results found.   1. Shoulder pain       MDM  Pt presenting with shoulder pain that has been ongoing for the past several months.  She has MRI scheduled for tomorrow.  She has seen orthopedics and recommended to take OTC medications.  She has gotten percocet rx from ED last week for this pain.  I have given her percocet for the pain in the ED.  Discussion with patient about need to d/w her PMD ([facility]) and/or with her  orthopedic physician if she needs further pain prescriptions.  Pt became very upset and shouting and aggressive.  I explained again that since she had a PMD and was under the care of orthopedics that she should arrange for followup with them and pain prescriptions if needed.  Pt left the ED prior to receiving discharge paperwork and very upset.  Discharged with strict return precautions.  Pt agreeable with plan.        Ethelda Chick, MD 12/31/12 1118

## 2012-12-31 NOTE — ED Notes (Signed)
Family at bedside. 

## 2012-12-31 NOTE — ED Notes (Signed)
Pt states that she has been having R shoulder pain x 3 months.  She reports that she has followed up with Dr. Victorino Dike [facility])  She reportedly has a MRI ordered for tomorrow but the pain has been to severe.  Pain is sharp in nature and increases with movement.  No distress noted.

## 2013-03-22 ENCOUNTER — Ambulatory Visit: Payer: Medicaid Other

## 2013-03-28 ENCOUNTER — Ambulatory Visit: Payer: Medicaid Other | Attending: Specialist | Admitting: Physical Therapy

## 2013-10-21 ENCOUNTER — Emergency Department (HOSPITAL_COMMUNITY): Payer: Medicaid Other

## 2013-10-21 ENCOUNTER — Encounter (HOSPITAL_COMMUNITY): Payer: Self-pay | Admitting: Emergency Medicine

## 2013-10-21 ENCOUNTER — Emergency Department (HOSPITAL_COMMUNITY)
Admission: EM | Admit: 2013-10-21 | Discharge: 2013-10-21 | Disposition: A | Discharge status: Home / Self Care | Payer: Medicaid Other | Attending: Emergency Medicine | Admitting: Emergency Medicine

## 2013-10-21 DIAGNOSIS — Z8659 Personal history of other mental and behavioral disorders: Secondary | ICD-10-CM | POA: Insufficient documentation

## 2013-10-21 DIAGNOSIS — R1110 Vomiting, unspecified: Secondary | ICD-10-CM | POA: Insufficient documentation

## 2013-10-21 DIAGNOSIS — Z8669 Personal history of other diseases of the nervous system and sense organs: Secondary | ICD-10-CM | POA: Insufficient documentation

## 2013-10-21 DIAGNOSIS — Z880 Allergy status to penicillin: Secondary | ICD-10-CM | POA: Insufficient documentation

## 2013-10-21 DIAGNOSIS — Z791 Long term (current) use of non-steroidal anti-inflammatories (NSAID): Secondary | ICD-10-CM | POA: Insufficient documentation

## 2013-10-21 DIAGNOSIS — Z79899 Other long term (current) drug therapy: Secondary | ICD-10-CM | POA: Insufficient documentation

## 2013-10-21 DIAGNOSIS — R0789 Other chest pain: Secondary | ICD-10-CM

## 2013-10-21 DIAGNOSIS — R0602 Shortness of breath: Secondary | ICD-10-CM | POA: Insufficient documentation

## 2013-10-21 DIAGNOSIS — F17200 Nicotine dependence, unspecified, uncomplicated: Secondary | ICD-10-CM

## 2013-10-21 HISTORY — DX: Bipolar disorder, unspecified: F31.9

## 2013-10-21 LAB — BASIC METABOLIC PANEL
BUN: 8 mg/dL (ref 6–23)
Calcium: 8.9 mg/dL (ref 8.4–10.5)
Creatinine, Ser: 0.73 mg/dL (ref 0.50–1.10)
GFR calc Af Amer: >90 mL/min (ref >90)
GFR calc non Af Amer: >90 mL/min (ref >90)
Glucose, Bld: 102 mg/dL — ABNORMAL HIGH (ref 70–99)
Potassium: 3.4 mEq/L — ABNORMAL LOW (ref 3.5–5.1)

## 2013-10-21 LAB — CBC WITH DIFFERENTIAL/PLATELET
Basophils Relative: 1 % (ref 0–1)
Eosinophils Absolute: 0.3 10*3/uL (ref 0.0–0.7)
Eosinophils Relative: 3 % (ref 0–5)
Hemoglobin: 15.3 g/dL — ABNORMAL HIGH (ref 12.0–15.0)
Lymphs Abs: 3.6 10*3/uL (ref 0.7–4.0)
MCH: 33 pg (ref 26.0–34.0)
MCHC: 34.3 g/dL (ref 30.0–36.0)
MCV: 96.1 fL (ref 78.0–100.0)
Monocytes Absolute: 0.7 10*3/uL (ref 0.1–1.0)
Monocytes Relative: 7 % (ref 3–12)
Neutrophils Relative %: 53 % (ref 43–77)
RBC: 4.64 MIL/uL (ref 3.87–5.11)

## 2013-10-21 LAB — POCT I-STAT TROPONIN I

## 2013-10-21 MED ORDER — ASPIRIN 81 MG PO CHEW
324.0000 mg | CHEWABLE_TABLET | Freq: Once | ORAL | Status: AC
  Administered 2013-10-21: 324 mg via ORAL
  Filled 2013-10-21: qty 4

## 2013-10-21 MED ORDER — ACETAMINOPHEN 80 MG PO CHEW
80.0000 mg | CHEWABLE_TABLET | Freq: Once | ORAL | Status: DC
  Filled 2013-10-21: qty 1

## 2013-10-21 MED ORDER — NITROGLYCERIN 0.4 MG SL SUBL
0.4000 mg | SUBLINGUAL_TABLET | SUBLINGUAL | Status: AC | PRN
  Administered 2013-10-21 (×3): 0.4 mg via SUBLINGUAL
  Filled 2013-10-21: qty 25

## 2013-10-21 NOTE — ED Notes (Signed)
Pt. States "I feel so much better. I'm just a little jittery now".

## 2013-10-21 NOTE — ED Notes (Signed)
Pt. Reports central non-radiating chest pain with SOB, dizziness, mild nausea. Denies hx of same.

## 2013-10-21 NOTE — ED Provider Notes (Signed)
Medical screening examination/treatment/procedure(s) were conducted as a shared visit with non-physician practitioner(s) and myself.  I personally evaluated the patient during the encounter.  52 year old female with atypical chest pain for past 12-24 hours. Completely resolved the time of my examination. Heart sounds normal, no murmurs, rubs, gallops. Lungs clear to auscultation bilaterally. Tenderness to palpation left sternal border. EKG normal. Labs reassuring. Plan delta troponin and likely discharge.  Doubt PE, doubt dissection.    Clinical Impression: 1. Atypical chest pain   2. Tobacco use disorder        Candyce Churn, MD 10/21/13 [phone]

## 2013-10-21 NOTE — ED Provider Notes (Signed)
CSN: [id number]     Arrival date & time 10/21/13  5621 History   First MD Initiated Contact with Patient 10/21/13 0615     Chief Complaint  Patient presents with  . Chest Pain   (Consider location/radiation/quality/duration/timing/severity/associated sxs/prior Treatment) HPI  Kimberly Henson is a 52 y.o. female with past medical history significant for bipolar and restless leg syndrome c/o left-sided, nonradiating chest pain, described as sharp and stabbing, rated at 9/10 at worst, 9/10 now. No exacerbating or alleviating factors identified, patient took Motrin with no relief. Pain started at 1 PM yesterday while patient was working around the house. Pain worsened over the course of the last day. Pain has been constant, non-exertional, non-pleuritic or positional. Pain is associated with shortness of breath and single episode of posttussive, nonbloody, nonbilious, non coffee ground emesis.  Denies SOB, N/V, diaphoresis, productive, cough, fever, back pain, syncope, prior episodes, recent cocaine/methamphetimine use. Denies h/o DVT, PE,  recent travel, leg swelling, hemoptysis.  Pt has not received any ASA or NTG in the last 24 hours.  RF: Active daily smoker Cath: ? Last Stress test: ? Cardiologost: ? PCP: Duanne Guess   Past Medical History  Diagnosis Date  . Bipolar 1 disorder     No family history on file. History  Substance Use Topics  . Smoking status: Current Every Day Smoker  . Smokeless tobacco: Not on file  . Alcohol Use: No   OB History   Grav Para Term Preterm Abortions TAB SAB Ect Mult Living                 Review of Systems 10 systems reviewed and found to be negative, except as noted in the HPI   Allergies  Penicillins  Home Medications   Current Outpatient Rx  Name  Route  Sig  Dispense  Refill  . ibuprofen (ADVIL,MOTRIN) 200 MG tablet   Oral   Take 800-1,000 mg by mouth 2 (two) times daily as needed. For pain         . naproxen sodium (ANAPROX) 220 MG  tablet   Oral   Take 440-660 mg by mouth 2 (two) times daily as needed. For pain         . oxyCODONE-acetaminophen (PERCOCET/ROXICET) 5-325 MG per tablet   Oral   Take 1-2 tablets by mouth every 4 (four) hours as needed for pain.   20 tablet   0    BP 114/75  Pulse 79  Temp(Src) 97.7 F (36.5 C) (Oral)  Resp 16  SpO2 95% Physical Exam  Nursing note and vitals reviewed. Constitutional: She is oriented to person, place, and time. She appears well-developed and well-nourished. No distress.  Tearful, appears uncomfortable, hands to the left chest.  HENT:  Head: Normocephalic and atraumatic.  Mouth/Throat: Oropharynx is clear and moist.  Eyes: Conjunctivae and EOM are normal. Pupils are equal, round, and reactive to light.  Neck: Normal range of motion.  Cardiovascular: Normal rate, regular rhythm and intact distal pulses.   Pulmonary/Chest: Effort normal and breath sounds normal. No stridor. No respiratory distress. She has no wheezes. She has no rales. She exhibits no tenderness.  No TTP   Abdominal: Soft. Bowel sounds are normal. She exhibits no distension and no mass. There is no tenderness. There is no rebound and no guarding.  Musculoskeletal: Normal range of motion.  No calf asymmetry, superficial collaterals, palpable cords, edema, Homans sign negative bilaterally.    Neurological: She is alert and oriented to person, place,  and time.  Psychiatric: She has a normal mood and affect.    ED Course  Procedures (including critical care time) Labs Review Labs Reviewed  CBC WITH DIFFERENTIAL - Abnormal; Notable for the following:    Hemoglobin 15.3 (*)    All other components within normal limits  BASIC METABOLIC PANEL - Abnormal; Notable for the following:    Potassium 3.4 (*)    Glucose, Bld 102 (*)    All other components within normal limits  PRO B NATRIURETIC PEPTIDE  POCT I-STAT TROPONIN I  POCT I-STAT TROPONIN I   Imaging Review Dg Chest 2 View  10/21/2013    CLINICAL DATA:  Chest pain and shortness of breath  EXAM: CHEST  2 VIEW  COMPARISON:  None.  FINDINGS: The lungs are clear. Heart size is upper normal with normal pulmonary vascularity. No adenopathy. No bone lesions. There is degenerative change in the thoracic spine. No pneumothorax.  IMPRESSION: No edema or consolidation.   Electronically Signed   By: Bretta Bang M.D.   On: 10/21/2013 07:26    EKG Interpretation   None       Date: 10/21/2013  Rate: 79  Rhythm: normal sinus rhythm  QRS Axis: normal  Intervals: normal  ST/T Wave abnormalities: normal  Conduction Disutrbances:none  Narrative Interpretation: Poor R wave progression with early transition  Old EKG Reviewed: none available  7:39 AM patient seen and examined at the bedside, she states her pain has now completely resolved.  MDM   1. Atypical chest pain   2. Tobacco use disorder      Filed Vitals:   10/21/13 0605 10/21/13 0630  BP: 114/75 118/80  Pulse: 79 80  Temp: 97.7 F (36.5 C)   TempSrc: Oral   Resp: 16 17  SpO2: 95% 94%     Kimberly Henson is a 52 y.o. female with atypical chest pain, left-sided, constant, worsening over the course of the last 16 hours. EKG is nonischemic and troponin is negative. Doubt this is cardiac in nature. Chest x-ray and blood work unremarkable. Discussed case with attending who agrees with plan and stability to d/c to home.  Delta Troponin is negative.    Medications  nitroGLYCERIN (NITROSTAT) SL tablet 0.4 mg (0.4 mg Sublingual Given 10/21/13 0648)  aspirin chewable tablet 324 mg (324 mg Oral Given 10/21/13 0637)    Pt is hemodynamically stable, appropriate for, and amenable to discharge at this time. Pt verbalized understanding and agrees with care plan. All questions answered. Outpatient follow-up and specific return precautions discussed.    Note: Portions of this report may have been transcribed using voice recognition software. Every effort was made to ensure  accuracy; however, inadvertent computerized transcription errors may be present      Wynetta Emery, PA-C 10/22/13 1541

## 2013-10-21 NOTE — ED Notes (Signed)
Patient will not be still.   BP low most probably due to patient laying on her side and keeping her legs crossed.   Patient states she has RLS and can't keep her legs still.

## 2013-10-21 NOTE — ED Notes (Signed)
Patient wanted to eat before discharge.    Gave patient sandwich bag per request.   Patient given gingerale.  Advised that we would have to get a last set of vitals and IV out before she leaves.  Patient verbalized understanding.

## 2013-10-21 NOTE — ED Notes (Signed)
[place], MD at bedside for evaluation. 

## 2013-10-21 NOTE — ED Notes (Signed)
Patient ambulated to restroom and tolerated well.  

## 2013-10-26 NOTE — ED Provider Notes (Signed)
Medical screening examination/treatment/procedure(s) were conducted as a shared visit with non-physician practitioner(s) and myself.  I personally evaluated the patient during the encounter.   Please see my separate note.      Candyce Churn, MD 10/26/13 2051

## 2014-03-15 ENCOUNTER — Emergency Department (INDEPENDENT_AMBULATORY_CARE_PROVIDER_SITE_OTHER): Payer: Self-pay

## 2014-03-15 ENCOUNTER — Emergency Department (INDEPENDENT_AMBULATORY_CARE_PROVIDER_SITE_OTHER)
Admission: EM | Admit: 2014-03-15 | Discharge: 2014-03-15 | Disposition: A | Discharge status: Home / Self Care | Payer: Medicaid Other | Source: Home / Self Care | Attending: Emergency Medicine | Admitting: Emergency Medicine

## 2014-03-15 ENCOUNTER — Encounter (HOSPITAL_COMMUNITY): Payer: Self-pay | Admitting: Emergency Medicine

## 2014-03-15 DIAGNOSIS — J069 Acute upper respiratory infection, unspecified: Secondary | ICD-10-CM

## 2014-03-15 LAB — POCT RAPID STREP A: Streptococcus, Group A Screen (Direct): NEGATIVE

## 2014-03-15 MED ORDER — BENZONATATE 100 MG PO CAPS: 100.0000 mg | ORAL_CAPSULE | Freq: Three times a day (TID) | ORAL | Status: DC | PRN

## 2014-03-15 NOTE — ED Notes (Signed)
Pt  Reports   Symptoms  Of    sorethroat      Coughing   Nasal  stuffyness  And  Congestion  With  The  Symptoms     X  10  Days         Pt  Reports  Symptoms  Not  releived  By  [facility]  meds

## 2014-03-15 NOTE — Discharge Instructions (Signed)
Your chest xray was normal. Your strep test was negative. Medication as directed for cough. Plenty of fluids and rest

## 2014-03-15 NOTE — ED Provider Notes (Signed)
Medical screening examination/treatment/procedure(s) were performed by non-physician practitioner and as supervising physician I was immediately available for consultation/collaboration.  Leslee Homeavid Boyde Grieco, M.D.  Reuben Likesavid C Aleayah [place]ico, MD 03/15/14 [phone]

## 2014-03-15 NOTE — ED Provider Notes (Signed)
CSN: [id number]     Arrival date & time 03/15/14  1139 History   First MD Initiated Contact with Patient 03/15/14 1249     Chief Complaint  Patient presents with  . Sore Throat   (Consider location/radiation/quality/duration/timing/severity/associated sxs/prior Treatment) Patient is a 53 y.o. female presenting with URI.  URI Presenting symptoms: congestion, cough, fatigue, fever, rhinorrhea and sore throat   Severity:  Moderate Onset quality:  Gradual Duration:  5 days Timing:  Constant Progression:  Unchanged Chronicity:  New Associated symptoms: no wheezing   Associated symptoms comment:  +N/V and single day of diarrhea Risk factors comment:  +smoker   Past Medical History  Diagnosis Date  . Bipolar 1 disorder    Past Surgical History  Procedure Laterality Date  . Ectopic pregnancy surgery     History reviewed. No pertinent family history. History  Substance Use Topics  . Smoking status: Current Every Day Smoker  . Smokeless tobacco: Not on file  . Alcohol Use: No   OB History   Grav Para Term Preterm Abortions TAB SAB Ect Mult Living                 Review of Systems  Constitutional: Positive for fever and fatigue.  HENT: Positive for congestion, rhinorrhea and sore throat.   Eyes: Negative.   Respiratory: Positive for cough. Negative for chest tightness, shortness of breath and wheezing.   Cardiovascular: Negative.   Gastrointestinal: Positive for nausea, vomiting and diarrhea. Negative for abdominal pain.  Genitourinary: Negative.   Musculoskeletal: Negative.   Skin: Negative.   Neurological: Negative.     Allergies  Penicillins  Home Medications   Current Outpatient Rx  Name  Route  Sig  Dispense  Refill  . benzonatate (TESSALON) 100 MG capsule   Oral   Take 1 capsule (100 mg total) by mouth 3 (three) times daily as needed for cough.   21 capsule   0   . ibuprofen (ADVIL,MOTRIN) 200 MG tablet   Oral   Take 400 mg by mouth 2 (two) times daily  as needed. For pain          BP 146/93  Pulse 92  Temp(Src) 98.2 F (36.8 C) (Oral)  Resp 16  SpO2 98% Physical Exam  Nursing note and vitals reviewed. Constitutional: She is oriented to person, place, and time. She appears well-developed and well-nourished. No distress.  HENT:  Head: Normocephalic and atraumatic.  Right Ear: Hearing, tympanic membrane, external ear and ear canal normal.  Left Ear: Hearing, tympanic membrane, external ear and ear canal normal.  Nose: Nose normal.  Mouth/Throat: Uvula is midline, oropharynx is clear and moist and mucous membranes are normal.  Eyes: Conjunctivae are normal.  Neck: Normal range of motion. Neck supple. No thyromegaly present.  Cardiovascular: Normal rate, regular rhythm and normal heart sounds.   Pulmonary/Chest: Effort normal and breath sounds normal.  Abdominal: Soft. Bowel sounds are normal.  Musculoskeletal: Normal range of motion.  Lymphadenopathy:    She has no cervical adenopathy.  Neurological: She is alert and oriented to person, place, and time.  Skin: Skin is warm and dry. No rash noted.  Psychiatric: She has a normal mood and affect. Her behavior is normal.    ED Course  Procedures (including critical care time) Labs Review Labs Reviewed  CULTURE, GROUP A STREP  POCT RAPID STREP A ([facility] CARE ONLY)   Imaging Review Dg Chest 2 View  03/15/2014   CLINICAL DATA:  Sick for  10 days with cough and low-grade fever as well as sore throat and laryngitis, smoking history  EXAM: CHEST  2 VIEW  COMPARISON:  DG CHEST 2 VIEW dated 10/21/2013  FINDINGS: Heart size normal. Vascular pattern normal. Lungs clear. Mild deformity left lateral fourth rib consistent with prior fracture, stable.  IMPRESSION: No active cardiopulmonary disease.   Electronically Signed   By: Esperanza Heir M.D.   On: 03/15/2014 13:43     MDM   1. URI (upper respiratory infection)    Common cold/ URI: delsym or tessalon for cough. Tylenol, fluids,  rest. Rapid strep negative and CXR unremarkable for acute disease.   Jess Barters [place], [place] 03/15/14 [phone]

## 2014-03-17 LAB — CULTURE, GROUP A STREP

## 2014-09-01 ENCOUNTER — Emergency Department (HOSPITAL_COMMUNITY): Payer: Medicaid Other

## 2014-09-01 ENCOUNTER — Emergency Department (HOSPITAL_COMMUNITY)
Admission: EM | Admit: 2014-09-01 | Discharge: 2014-09-02 | Disposition: A | Discharge status: Home / Self Care | Payer: Medicaid Other | Attending: Emergency Medicine | Admitting: Emergency Medicine

## 2014-09-01 ENCOUNTER — Encounter (HOSPITAL_COMMUNITY): Payer: Self-pay | Admitting: Emergency Medicine

## 2014-09-01 DIAGNOSIS — Z79899 Other long term (current) drug therapy: Secondary | ICD-10-CM | POA: Insufficient documentation

## 2014-09-01 DIAGNOSIS — Z8659 Personal history of other mental and behavioral disorders: Secondary | ICD-10-CM | POA: Diagnosis not present

## 2014-09-01 DIAGNOSIS — R112 Nausea with vomiting, unspecified: Secondary | ICD-10-CM | POA: Diagnosis not present

## 2014-09-01 DIAGNOSIS — R1031 Right lower quadrant pain: Secondary | ICD-10-CM | POA: Insufficient documentation

## 2014-09-01 DIAGNOSIS — IMO0002 Reserved for concepts with insufficient information to code with codable children: Secondary | ICD-10-CM | POA: Diagnosis not present

## 2014-09-01 DIAGNOSIS — F17200 Nicotine dependence, unspecified, uncomplicated: Secondary | ICD-10-CM | POA: Diagnosis not present

## 2014-09-01 DIAGNOSIS — Z3202 Encounter for pregnancy test, result negative: Secondary | ICD-10-CM | POA: Insufficient documentation

## 2014-09-01 DIAGNOSIS — Y939 Activity, unspecified: Secondary | ICD-10-CM | POA: Diagnosis not present

## 2014-09-01 DIAGNOSIS — S76219A Strain of adductor muscle, fascia and tendon of unspecified thigh, initial encounter: Secondary | ICD-10-CM

## 2014-09-01 DIAGNOSIS — Y929 Unspecified place or not applicable: Secondary | ICD-10-CM | POA: Insufficient documentation

## 2014-09-01 DIAGNOSIS — Z880 Allergy status to penicillin: Secondary | ICD-10-CM | POA: Diagnosis not present

## 2014-09-01 DIAGNOSIS — X58XXXA Exposure to other specified factors, initial encounter: Secondary | ICD-10-CM | POA: Insufficient documentation

## 2014-09-01 DIAGNOSIS — R1030 Lower abdominal pain, unspecified: Secondary | ICD-10-CM

## 2014-09-01 LAB — URINALYSIS, ROUTINE W REFLEX MICROSCOPIC
GLUCOSE, UA: 100 mg/dL — AB
Hgb urine dipstick: NEGATIVE
Ketones, ur: 15 mg/dL — AB
Leukocytes, UA: NEGATIVE
Nitrite: NEGATIVE
PROTEIN: NEGATIVE mg/dL
Specific Gravity, Urine: 1.031 — ABNORMAL HIGH (ref 1.005–1.030)
UROBILINOGEN UA: 1 mg/dL (ref 0.0–1.0)
pH: 6 (ref 5.0–8.0)

## 2014-09-01 LAB — COMPREHENSIVE METABOLIC PANEL
ALT: 32 U/L (ref 0–35)
ANION GAP: 14 (ref 5–15)
AST: 24 U/L (ref 0–37)
Albumin: 4.2 g/dL (ref 3.5–5.2)
Alkaline Phosphatase: 83 U/L (ref 39–117)
BUN: 10 mg/dL (ref 6–23)
CALCIUM: 9.7 mg/dL (ref 8.4–10.5)
CO2: 26 mEq/L (ref 19–32)
Chloride: 98 mEq/L (ref 96–112)
Creatinine, Ser: 0.7 mg/dL (ref 0.50–1.10)
GFR calc non Af Amer: >90 mL/min (ref >90)
GLUCOSE: 98 mg/dL (ref 70–99)
Potassium: 4.2 mEq/L (ref 3.7–5.3)
SODIUM: 138 meq/L (ref 137–147)
TOTAL PROTEIN: 7.5 g/dL (ref 6.0–8.3)
Total Bilirubin: 0.8 mg/dL (ref 0.3–1.2)

## 2014-09-01 LAB — CBC WITH DIFFERENTIAL/PLATELET
BASOS ABS: 0 10*3/uL (ref 0.0–0.1)
BASOS PCT: 1 % (ref 0–1)
EOS PCT: 1 % (ref 0–5)
Eosinophils Absolute: 0.1 10*3/uL (ref 0.0–0.7)
HCT: 47.4 % — ABNORMAL HIGH (ref 36.0–46.0)
Hemoglobin: 16.4 g/dL — ABNORMAL HIGH (ref 12.0–15.0)
LYMPHS ABS: 1 10*3/uL (ref 0.7–4.0)
Lymphocytes Relative: 11 % — ABNORMAL LOW (ref 12–46)
MCH: 34 pg (ref 26.0–34.0)
MCHC: 34.6 g/dL (ref 30.0–36.0)
MCV: 98.1 fL (ref 78.0–100.0)
Monocytes Absolute: 0.6 10*3/uL (ref 0.1–1.0)
Monocytes Relative: 7 % (ref 3–12)
Neutro Abs: 7.2 10*3/uL (ref 1.7–7.7)
Neutrophils Relative %: 80 % — ABNORMAL HIGH (ref 43–77)
PLATELETS: 306 10*3/uL (ref 150–400)
RBC: 4.83 MIL/uL (ref 3.87–5.11)
RDW: 13 % (ref 11.5–15.5)
WBC: 8.9 10*3/uL (ref 4.0–10.5)

## 2014-09-01 LAB — PREGNANCY, URINE: PREG TEST UR: NEGATIVE

## 2014-09-01 LAB — WET PREP, GENITAL
Clue Cells Wet Prep HPF POC: NONE SEEN
Trich, Wet Prep: NONE SEEN
Yeast Wet Prep HPF POC: NONE SEEN

## 2014-09-01 LAB — LIPASE, BLOOD: Lipase: 28 U/L (ref 11–59)

## 2014-09-01 MED ORDER — MORPHINE SULFATE 4 MG/ML IJ SOLN
4.0000 mg | Freq: Once | INTRAMUSCULAR | Status: AC
  Administered 2014-09-01: 4 mg via INTRAVENOUS
  Filled 2014-09-01: qty 1

## 2014-09-01 MED ORDER — HYDROMORPHONE HCL PF 1 MG/ML IJ SOLN
1.0000 mg | Freq: Once | INTRAMUSCULAR | Status: AC
  Administered 2014-09-01: 1 mg via INTRAVENOUS
  Filled 2014-09-01: qty 1

## 2014-09-01 MED ORDER — SODIUM CHLORIDE 0.9 % IV BOLUS (SEPSIS)
1000.0000 mL | Freq: Once | INTRAVENOUS | Status: AC
  Administered 2014-09-01: 1000 mL via INTRAVENOUS

## 2014-09-01 MED ORDER — ONDANSETRON HCL 4 MG/2ML IJ SOLN
4.0000 mg | Freq: Once | INTRAMUSCULAR | Status: AC
  Administered 2014-09-01: 4 mg via INTRAVENOUS
  Filled 2014-09-01: qty 2

## 2014-09-01 MED ORDER — OXYCODONE-ACETAMINOPHEN 5-325 MG PO TABS
1.0000 | ORAL_TABLET | Freq: Once | ORAL | Status: AC
Start: 2014-09-01 — End: 2014-09-01
  Administered 2014-09-01: 1 via ORAL
  Filled 2014-09-01: qty 1

## 2014-09-01 MED ORDER — IOHEXOL 300 MG/ML  SOLN
25.0000 mL | Freq: Once | INTRAMUSCULAR | Status: AC | PRN
  Administered 2014-09-01: 25 mL via ORAL

## 2014-09-01 MED ORDER — IOHEXOL 300 MG/ML  SOLN
80.0000 mL | Freq: Once | INTRAMUSCULAR | Status: AC | PRN
  Administered 2014-09-01: 80 mL via INTRAVENOUS

## 2014-09-01 MED ORDER — ONDANSETRON 4 MG PO TBDP
8.0000 mg | ORAL_TABLET | Freq: Once | ORAL | Status: AC
  Administered 2014-09-01: 8 mg via ORAL
  Filled 2014-09-01: qty 2

## 2014-09-01 NOTE — ED Notes (Signed)
The pt has had rt lower abd pain since last pm with nausea.  Crying in triage.  No previous history

## 2014-09-01 NOTE — ED Provider Notes (Signed)
CSN: [id number]     Arrival date & time 09/01/14  1532 History   First MD Initiated Contact with Patient 09/01/14 1913     Chief Complaint  Patient presents with  . Abdominal Pain     (Consider location/radiation/quality/duration/timing/severity/associated sxs/prior Treatment) The history is provided by the patient and medical records. No language interpreter was used.    Kimberly Henson is a 53 y.o. female  with a hx Bipolar disorder of presents to the Emergency Department complaining of gradual, persistent, progressively worsening RLQ abd pain onset last night while getting dressed.  Associated symptoms include nausea and vomiting x1 this AM due to pain.  Pt has taken 2 tylenol without relief.  Sitting and laying makes it better and walking and movement makes it significantly worse.  Pt denies fever, chills, headache, neck pain, chest pain, SOB, diarrhea, weakness, dizziness, syncope, dysuria, vaginal discharge.  LMP:  1 year ago.  Pt has 1 female sexual partner and has not had an STD in years.  She reports they have intercourse every night, but denies new positions or activities.  Patient denies no activities for which abdominal or groin strain might have occurred.   Past Medical History  Diagnosis Date  . Bipolar 1 disorder    Past Surgical History  Procedure Laterality Date  . Ectopic pregnancy surgery     No family history on file. History  Substance Use Topics  . Smoking status: Current Every Day Smoker  . Smokeless tobacco: Not on file  . Alcohol Use: No   OB History   Grav Para Term Preterm Abortions TAB SAB Ect Mult Living                 Review of Systems  Constitutional: Negative for fever, diaphoresis, appetite change, fatigue and unexpected weight change.  HENT: Negative for mouth sores and trouble swallowing.   Eyes: Negative for visual disturbance.  Respiratory: Negative for cough, chest tightness, shortness of breath, wheezing and stridor.   Cardiovascular:  Negative for chest pain and palpitations.  Gastrointestinal: Positive for abdominal pain ( Right lower quadrant). Negative for nausea, vomiting, diarrhea, constipation, blood in stool, abdominal distention and rectal pain.  Endocrine: Negative for polydipsia, polyphagia and polyuria.  Genitourinary: Negative for dysuria, urgency, frequency, hematuria, flank pain and difficulty urinating.  Musculoskeletal: Positive for arthralgias (right groin). Negative for back pain, neck pain and neck stiffness.  Skin: Negative for rash.  Allergic/Immunologic: Negative for immunocompromised state.  Neurological: Negative for syncope, weakness, light-headedness and headaches.  Hematological: Negative for adenopathy. Does not bruise/bleed easily.  Psychiatric/Behavioral: Negative for confusion and sleep disturbance. The patient is not nervous/anxious.   All other systems reviewed and are negative.     Allergies  Penicillins  Home Medications   Prior to Admission medications   Medication Sig Start Date End Date Taking? Authorizing Provider  ibuprofen (ADVIL,MOTRIN) 200 MG tablet Take 400 mg by mouth 2 (two) times daily as needed. For pain   Yes Historical Provider, MD  diazepam (VALIUM) 5 MG tablet Take 1 tablet (5 mg total) by mouth 2 (two) times daily. 09/02/14   Philomene Haff, PA-C  HYDROcodone-acetaminophen (NORCO/VICODIN) 5-325 MG per tablet Take 1-2 tablets by mouth every 6 (six) hours as needed for moderate pain or severe pain. 09/02/14   Lanee Chain, PA-C   BP 134/89  Pulse 58  Temp(Src) 98 F (36.7 C) (Oral)  Resp 20  Ht  (1.575 m)  Wt 125 lb (56.7 kg)  BMI  22.86 kg/m2  SpO2 96% Physical Exam  Nursing note and vitals reviewed. Constitutional: She is oriented to person, place, and time. She appears well-developed and well-nourished. No distress.  Awake, alert, nontoxic appearance  HENT:  Head: Normocephalic and atraumatic.  Mouth/Throat: Oropharynx is clear and moist. No  oropharyngeal exudate.  Eyes: Conjunctivae are normal. No scleral icterus.  Neck: Normal range of motion. Neck supple.  Cardiovascular: Normal rate, regular rhythm, normal heart sounds and intact distal pulses.   No murmur heard. Pulmonary/Chest: Effort normal and breath sounds normal. No respiratory distress. She has no wheezes.  Equal chest expansion  Abdominal: Soft. Bowel sounds are normal. She exhibits no distension and no mass. There is no tenderness. There is no rebound, no guarding and no CVA tenderness. Hernia confirmed negative in the right inguinal area and confirmed negative in the left inguinal area.    Pain along the right lower pelvis into the right groin not reproducible with palpation  Genitourinary: Uterus normal. No labial fusion. There is no rash, tenderness or lesion on the right labia. There is no rash, tenderness or lesion on the left labia. Uterus is not deviated, not enlarged, not fixed and not tender. Cervix exhibits no motion tenderness, no discharge and no friability. Right adnexum displays no mass, no tenderness and no fullness. Left adnexum displays no mass, no tenderness and no fullness. No erythema, tenderness or bleeding around the vagina. No foreign body around the vagina. No signs of injury around the vagina. No vaginal discharge found.  Musculoskeletal: Normal range of motion. She exhibits no edema.       Right hip: She exhibits normal range of motion.  Pain with range of motion of right hip and right groin however are full range of motion and 5/5 strength in the bilateral lower extremities  Lymphadenopathy:       Right: No inguinal adenopathy present.       Left: No inguinal adenopathy present.  Neurological: She is alert and oriented to person, place, and time. She exhibits normal muscle tone. Coordination normal.  Speech is clear and goal oriented Moves extremities without ataxia  Skin: Skin is warm and dry. She is not diaphoretic. No erythema.    Psychiatric: She has a normal mood and affect.    ED Course  Procedures (including critical care time) Labs Review Labs Reviewed  WET PREP, GENITAL - Abnormal; Notable for the following:    WBC, Wet Prep HPF POC FEW (*)    All other components within normal limits  CBC WITH DIFFERENTIAL - Abnormal; Notable for the following:    Hemoglobin 16.4 (*)    HCT 47.4 (*)    Neutrophils Relative % 80 (*)    Lymphocytes Relative 11 (*)    All other components within normal limits  URINALYSIS, ROUTINE W REFLEX MICROSCOPIC - Abnormal; Notable for the following:    Color, Urine AMBER (*)    APPearance CLOUDY (*)    Specific Gravity, Urine 1.031 (*)    Glucose, UA 100 (*)    Bilirubin Urine SMALL (*)    Ketones, ur 15 (*)    All other components within normal limits  GC/CHLAMYDIA PROBE AMP  COMPREHENSIVE METABOLIC PANEL  LIPASE, BLOOD  PREGNANCY, URINE    Imaging Review US Transvaginal Non-ob  09/02/2014   CLINICAL DATA:  Right lower quadrant pain. Clinical suspicion for ovarian torsion.  EXAM: TRANSABDOMINAL AND TRANSVAGINAL ULTRASOUND OF PELVIS  DOPPLER ULTRASOUND OF OVARIES  TECHNIQUE: Both transabdominal and transvaginal ultrasound examinations  of the pelvis were performed. Transabdominal technique was performed for global imaging of the pelvis including uterus, ovaries, adnexal regions, and pelvic cul-de-sac.  It was necessary to proceed with endovaginal exam following the transabdominal exam to visualize the endometrial stripe and ovaries. Color and duplex Doppler ultrasound was utilized to evaluate blood flow to the ovaries.  COMPARISON:  CT on 09/01/2014  FINDINGS: Uterus  Measurements: 6.3 x 3.1 x 4.2 cm. 1.5 cm fibroid is seen in the posterior uterine corpus.  Endometrium  Thickness: 2 mm.  No focal abnormality visualized.  Right ovary  Measurements: 1.7 x 1.7 x 1.1 cm. Normal appearance/no adnexal mass.  Left ovary  Measurements: 2.4 x 1.2 x 1.2 cm. Normal appearance/no adnexal mass.   Pulsed Doppler evaluation of both ovaries demonstrates normal low-resistance arterial and venous waveforms.  Other findings  No free fluid.  IMPRESSION: 1.5 cm posterior uterine fibroid.  Normal appearance of both ovaries.  No adnexal mass identified.  No sonographic evidence for ovarian torsion.   Electronically Signed   By: Myles Rosenthal M.D.   On: 09/02/2014 01:01   US Pelvis Complete  09/02/2014   CLINICAL DATA:  Right lower quadrant pain. Clinical suspicion for ovarian torsion.  EXAM: TRANSABDOMINAL AND TRANSVAGINAL ULTRASOUND OF PELVIS  DOPPLER ULTRASOUND OF OVARIES  TECHNIQUE: Both transabdominal and transvaginal ultrasound examinations of the pelvis were performed. Transabdominal technique was performed for global imaging of the pelvis including uterus, ovaries, adnexal regions, and pelvic cul-de-sac.  It was necessary to proceed with endovaginal exam following the transabdominal exam to visualize the endometrial stripe and ovaries. Color and duplex Doppler ultrasound was utilized to evaluate blood flow to the ovaries.  COMPARISON:  CT on 09/01/2014  FINDINGS: Uterus  Measurements: 6.3 x 3.1 x 4.2 cm. 1.5 cm fibroid is seen in the posterior uterine corpus.  Endometrium  Thickness: 2 mm.  No focal abnormality visualized.  Right ovary  Measurements: 1.7 x 1.7 x 1.1 cm. Normal appearance/no adnexal mass.  Left ovary  Measurements: 2.4 x 1.2 x 1.2 cm. Normal appearance/no adnexal mass.  Pulsed Doppler evaluation of both ovaries demonstrates normal low-resistance arterial and venous waveforms.  Other findings  No free fluid.  IMPRESSION: 1.5 cm posterior uterine fibroid.  Normal appearance of both ovaries.  No adnexal mass identified.  No sonographic evidence for ovarian torsion.   Electronically Signed   By: Myles Rosenthal M.D.   On: 09/02/2014 01:01   Ct Abdomen Pelvis W Contrast  09/01/2014   CLINICAL DATA:  Right lower quadrant abdominal pain, vomiting  EXAM: CT ABDOMEN AND PELVIS WITH CONTRAST  TECHNIQUE:  Multidetector CT imaging of the abdomen and pelvis was performed using the standard protocol following bolus administration of intravenous contrast.  CONTRAST:  80mL OMNIPAQUE IOHEXOL 300 MG/ML  SOLN  COMPARISON:  None.  FINDINGS: Right breast implant partly visualized. Lung bases are clear. Geographic 1.3 x 0.9 cm hypodensity within the medial segment left hepatic lobe is most likely focal fat or possibly hemangioma given the suggestion of peripheral discontiguous nodular enhancement. Gallbladder, adrenal glands, kidneys, spleen, and pancreas are unremarkable. No lymphadenopathy or ascites. No free air.  Appendix is normal. Bladder is decompressed but unremarkable. Uterus and ovaries are normal. No bowel wall thickening or focal segmental dilatation. No acute osseous abnormality. Mild multilevel disc degenerative change noted in the lumbar spine.  IMPRESSION: No acute intra-abdominal or pelvic pathology.  Probable focal fat or hemangioma in the medial segment left hepatic lobe. Conformation at outpatient nonemergent abdominal  MRI with contrast according to liver mass protocol is recommended for definitive characterization.   Electronically Signed   By: Christiana Pellant M.D.   On: 09/01/2014 20:29   [place] Art/ven Flow Abd Pelv Doppler  09/02/2014   CLINICAL DATA:  Right lower quadrant pain. Clinical suspicion for ovarian torsion.  EXAM: TRANSABDOMINAL AND TRANSVAGINAL ULTRASOUND OF PELVIS  DOPPLER ULTRASOUND OF OVARIES  TECHNIQUE: Both transabdominal and transvaginal ultrasound examinations of the pelvis were performed. Transabdominal technique was performed for global imaging of the pelvis including uterus, ovaries, adnexal regions, and pelvic cul-de-sac.  It was necessary to proceed with endovaginal exam following the transabdominal exam to visualize the endometrial stripe and ovaries. Color and duplex Doppler ultrasound was utilized to evaluate blood flow to the ovaries.  COMPARISON:  CT on 09/01/2014  FINDINGS:  Uterus  Measurements: 6.3 x 3.1 x 4.2 cm. 1.5 cm fibroid is seen in the posterior uterine corpus.  Endometrium  Thickness: 2 mm.  No focal abnormality visualized.  Right ovary  Measurements: 1.7 x 1.7 x 1.1 cm. Normal appearance/no adnexal mass.  Left ovary  Measurements: 2.4 x 1.2 x 1.2 cm. Normal appearance/no adnexal mass.  Pulsed Doppler evaluation of both ovaries demonstrates normal low-resistance arterial and venous waveforms.  Other findings  No free fluid.  IMPRESSION: 1.5 cm posterior uterine fibroid.  Normal appearance of both ovaries.  No adnexal mass identified.  No sonographic evidence for ovarian torsion.   Electronically Signed   By: Myles Rosenthal M.D.   On: 09/02/2014 01:01     EKG Interpretation None      MDM   Final diagnoses:  Groin strain, initial encounter  Lower abdominal pain   Kimberly Henson presents with RLQ, R pelvic and Right groin pain.  Pain is not reproducible on exam.  Pt with normal pelvic exam and no adnexal tenderness.  Labs reassuring, no leukocytosis.  Will give pain control and obtain CT.  10:15 PM CT scan unremarkable for RLQ abd pain. CT does find Probable focal fat or hemangioma in the medial segment left hepatic lobe.  Will obtain US of the pelvis to r/o torsion.    BP 144/78  Pulse 77  Temp(Src) 98.6 F (37 C) (Oral)  Resp 18  Ht  (1.575 m)  Wt 125 lb (56.7 kg)  BMI 22.86 kg/m2  SpO2 100%  1:17 AM [place] without evidence of torsion, cyst or other reason for pt's pain.  Clinically pt with strain of her groin and no concerning findings have been found to explain pt's pain.  Pt to be d/c home with muscle relaxers and pain control.  Conservative therapies such as stretching and heat also discussed.    Patient is nontoxic, nonseptic appearing, in no apparent distress.  Patient's pain and other symptoms adequately managed in emergency department.  Fluid bolus given.  Labs, imaging and vitals reviewed.  Patient does not meet the SIRS or Sepsis  criteria.  On repeat exam patient does not have a surgical abdomin and there are no peritoneal signs.  No indication of appendicitis, bowel obstruction, bowel perforation, cholecystitis, diverticulitis, PID or ectopic pregnancy.  Patient discharged home with symptomatic treatment and given strict instructions for follow-up with their primary care physician in 3 days.   I have personally reviewed patient's vitals, nursing note and any pertinent labs or imaging.  I performed an undressed physical exam.    At this time, it has been determined that no acute conditions requiring further emergency intervention. The patient/guardian  have been advised of the diagnosis and plan. I reviewed all labs and imaging including any potential incidental findings. We have discussed signs and symptoms that warrant return to the ED, such as fevers, intractable vomiting or worsening pain.  Vital signs are stable at discharge.   BP 134/89  Pulse 58  Temp(Src) 98 F (36.7 C) (Oral)  Resp 20  Ht  (1.575 m)  Wt 125 lb (56.7 kg)  BMI 22.86 kg/m2  SpO2 96%           Dierdre Forth, PA-C 09/02/14 0120

## 2014-09-02 LAB — GC/CHLAMYDIA PROBE AMP
CT Probe RNA: NEGATIVE
GC Probe RNA: NEGATIVE

## 2014-09-02 MED ORDER — DIAZEPAM 5 MG PO TABS
5.0000 mg | ORAL_TABLET | Freq: Once | ORAL | Status: AC
  Administered 2014-09-02: 5 mg via ORAL
  Filled 2014-09-02: qty 1

## 2014-09-02 MED ORDER — HYDROCODONE-ACETAMINOPHEN 5-325 MG PO TABS: 1.0000 | ORAL_TABLET | Freq: Four times a day (QID) | ORAL | Status: DC | PRN

## 2014-09-02 MED ORDER — OXYCODONE-ACETAMINOPHEN 5-325 MG PO TABS
2.0000 | ORAL_TABLET | Freq: Once | ORAL | Status: AC
  Administered 2014-09-02: 2 via ORAL
  Filled 2014-09-02: qty 2

## 2014-09-02 MED ORDER — DIAZEPAM 5 MG PO TABS: 5.0000 mg | ORAL_TABLET | Freq: Two times a day (BID) | ORAL | Status: DC

## 2014-09-02 NOTE — Discharge Instructions (Signed)
1. Medications: vicodin, valium, usual home medications 2. Treatment: rest, drink plenty of fluids, heat, gentle stretching 3. Follow Up: Please followup with your primary doctor in 3 days for discussion of your diagnoses and further evaluation after today's visit;    Groin Strain A groin strain (also called a groin pull) is an injury to the muscles or tendon on the upper inner part of the thigh. These muscles are called the adductor muscles or groin muscles. They are responsible for moving the leg across the body. A muscle strain occurs when a muscle is overstretched and some muscle fibers are torn. A groin strain can range from mild to severe depending on how many muscle fibers are affected and whether the muscle fibers are partially or completely torn.  Groin strains usually occur during exercise or participation in sports. The injury often happens when a sudden, violent force is placed on a muscle, stretching the muscle too far. A strain is more likely to occur when your muscles are not warmed up or if you are not properly conditioned. Depending on the severity of the groin strain, recovery time may vary from a few weeks to several weeks. Severe injuries often require 4-6 weeks for recovery. In these cases, complete healing can take 4-5 months.  CAUSES   Stretching the groin muscles too far or too suddenly, often during side-to-side motion with an abrupt change in direction.  Putting repeated stress on the groin muscles over a long period of time.  Performing vigorous activity without properly stretching the groin muscles beforehand. SYMPTOMS   Pain and tenderness in the groin area. This begins as sharp pain and persists as a dull ache.  Popping or snapping feeling when the injury occurs (for severe strains).  Swelling or bruising.  Muscle spasms.  Weakness in the leg.  Stiffness in the groin area with decreased ability to move the affected muscles. DIAGNOSIS  Your caregiver will  perform a physical exam to diagnose a groin strain. You will be asked about your symptoms and how the injury occurred. X-rays are sometimes needed to rule out a broken bone or cartilage problems. Your caregiver may order a CT scan or MRI if a complete muscle tear is suspected. TREATMENT  A groin strain will often heal on its own. Your caregiver may prescribe medicines to help manage pain and swelling (anti-inflammatory medicine). You may be told to use crutches for the first few days to minimize your pain. HOME CARE INSTRUCTIONS   Rest. Do not use the strained muscle if it causes pain.  Put ice on the injured area.  Put ice in a plastic bag.  Place a towel between your skin and the bag.  Leave the ice on for 15-20 minutes, every 2-3 hours. Do this for the first 2 days after the injury.  Only take over-the-counter or prescription medicines as directed by your caregiver.  Wrap the injured area with an elastic bandage as directed by your caregiver.  Keep the injured leg raised (elevated).  Walk, stretch, and perform range-of-motion exercises to improve blood flow to the injured area. Only perform these activities if you can do so without any pain. To prevent muscle strains:  Warm up before exercise.  Develop proper conditioning and strength in the groin muscles. SEEK IMMEDIATE MEDICAL CARE IF:   You have increased pain or swelling in the affected area.   Your symptoms are not improving or are getting worse. MAKE SURE YOU:   Understand these instructions.  Will watch  your condition.  Will get help right away if you are not doing well or get worse. Document Released: 08/12/2004 Document Revised: 12/01/2012 Document Reviewed: 08/18/2012 [facility] Patient Information 2015 [place], [place]. This information is not intended to replace advice given to you by your health care provider. Make sure you discuss any questions you have with your health care provider.

## 2014-09-02 NOTE — ED Provider Notes (Signed)
Medical screening examination/treatment/procedure(s) were performed by non-physician practitioner and as supervising physician I was immediately available for consultation/collaboration.   EKG Interpretation None       Shulamit Donofrio K Linker, MD 09/02/14 1508 

## 2015-05-31 ENCOUNTER — Encounter (HOSPITAL_COMMUNITY): Payer: Self-pay | Admitting: Emergency Medicine

## 2015-05-31 ENCOUNTER — Emergency Department (HOSPITAL_COMMUNITY): Payer: Medicaid Other

## 2015-05-31 ENCOUNTER — Emergency Department (HOSPITAL_COMMUNITY)
Admission: EM | Admit: 2015-05-31 | Discharge: 2015-05-31 | Disposition: A | Discharge status: Home / Self Care | Payer: Medicaid Other | Attending: Emergency Medicine | Admitting: Emergency Medicine

## 2015-05-31 DIAGNOSIS — S4991XA Unspecified injury of right shoulder and upper arm, initial encounter: Secondary | ICD-10-CM | POA: Diagnosis present

## 2015-05-31 DIAGNOSIS — S199XXA Unspecified injury of neck, initial encounter: Secondary | ICD-10-CM | POA: Insufficient documentation

## 2015-05-31 DIAGNOSIS — Z79899 Other long term (current) drug therapy: Secondary | ICD-10-CM | POA: Diagnosis not present

## 2015-05-31 DIAGNOSIS — Z720 Tobacco use: Secondary | ICD-10-CM | POA: Insufficient documentation

## 2015-05-31 DIAGNOSIS — G8929 Other chronic pain: Secondary | ICD-10-CM | POA: Insufficient documentation

## 2015-05-31 DIAGNOSIS — W06XXXA Fall from bed, initial encounter: Secondary | ICD-10-CM | POA: Insufficient documentation

## 2015-05-31 DIAGNOSIS — Z880 Allergy status to penicillin: Secondary | ICD-10-CM | POA: Insufficient documentation

## 2015-05-31 DIAGNOSIS — M25511 Pain in right shoulder: Secondary | ICD-10-CM

## 2015-05-31 DIAGNOSIS — Z8659 Personal history of other mental and behavioral disorders: Secondary | ICD-10-CM | POA: Diagnosis not present

## 2015-05-31 DIAGNOSIS — Y9289 Other specified places as the place of occurrence of the external cause: Secondary | ICD-10-CM | POA: Insufficient documentation

## 2015-05-31 DIAGNOSIS — Y998 Other external cause status: Secondary | ICD-10-CM | POA: Diagnosis not present

## 2015-05-31 DIAGNOSIS — Y9389 Activity, other specified: Secondary | ICD-10-CM | POA: Insufficient documentation

## 2015-05-31 MED ORDER — HYDROCODONE-ACETAMINOPHEN 5-325 MG PO TABS: 1.0000 | ORAL_TABLET | Freq: Four times a day (QID) | ORAL | Status: DC | PRN

## 2015-05-31 MED ORDER — OXYCODONE-ACETAMINOPHEN 5-325 MG PO TABS
1.0000 | ORAL_TABLET | Freq: Once | ORAL | Status: AC
  Administered 2015-05-31: 1 via ORAL
  Filled 2015-05-31: qty 1

## 2015-05-31 NOTE — Discharge Instructions (Signed)
Contusion °A contusion is a deep bruise. Contusions are the result of an injury that caused bleeding under the skin. The contusion may turn blue, purple, or yellow. Minor injuries will give you a painless contusion, but more severe contusions may stay painful and swollen for a few weeks.  °CAUSES  °A contusion is usually caused by a blow, trauma, or direct force to an area of the body. °SYMPTOMS  °· Swelling and redness of the injured area. °· Bruising of the injured area. °· Tenderness and soreness of the injured area. °· Pain. °DIAGNOSIS  °The diagnosis can be made by taking a history and physical exam. An X-ray, CT scan, or MRI may be needed to determine if there were any associated injuries, such as fractures. °TREATMENT  °Specific treatment will depend on what area of the body was injured. In general, the best treatment for a contusion is resting, icing, elevating, and applying cold compresses to the injured area. Over-the-counter medicines may also be recommended for pain control. Ask your caregiver what the best treatment is for your contusion. °HOME CARE INSTRUCTIONS  °· Put ice on the injured area. °¨ Put ice in a plastic bag. °¨ Place a towel between your skin and the bag. °¨ Leave the ice on for 15-20 minutes, 3-4 times a day, or as directed by your health care provider. °· Only take over-the-counter or prescription medicines for pain, discomfort, or fever as directed by your caregiver. Your caregiver may recommend avoiding anti-inflammatory medicines (aspirin, ibuprofen, and naproxen) for 48 hours because these medicines may increase bruising. °· Rest the injured area. °· If possible, elevate the injured area to reduce swelling. °SEEK IMMEDIATE MEDICAL CARE IF:  °· You have increased bruising or swelling. °· You have pain that is getting worse. °· Your swelling or pain is not relieved with medicines. °MAKE SURE YOU:  °· Understand these instructions. °· Will watch your condition. °· Will get help right  away if you are not doing well or get worse. °Document Released: 09/24/2005 Document Revised: 12/20/2013 Document Reviewed: 10/20/2011 °[employer]® Patient Information ©2015 [employer]. This information is not intended to replace advice given to you by your health care provider. Make sure you discuss any questions you have with your health care provider. ° °

## 2015-05-31 NOTE — ED Notes (Signed)
Pt states she rolled off of her bed at 0300 today. Her bed is elevated approx. 3 feet., falling onto a hardwood floor. Pt is crying, c/o right shoulder pain.

## 2015-05-31 NOTE — ED Provider Notes (Signed)
CSN: [id number]     Arrival date & time 05/31/15  1019 History  This chart was scribed for non-physician practitioner, Lonia SkinnerLeslie K. Keenan BachelorSofia, PA-C working with Jerelyn ScottMartha Linker, MD by Doreatha MartinEva Mathews, ED scribe. This patient was seen in room TR05C/TR05C and the patient's care was started at 11:21 AM     Chief Complaint  Patient presents with  . Shoulder Injury   The history is provided by the patient. No language interpreter was used.    HPI Comments: Kimberly Henson is a 54 y.o. female who presents to the Emergency Department complaining of gradual onset, moderate right-sided shoulder pain radiating to the neck onset 0300 due to a fall of 3 feet out of a bed. Pt states she got up from her bed expecting it to be lower and she fell onto the hardwood floor on her right side. She reports associated right hand pain. 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 Pt is allergic to Penicillin. She denies LOC and head injury.    Past Medical History  Diagnosis Date  . Bipolar 1 disorder    Past Surgical History  Procedure Laterality Date  . Ectopic pregnancy surgery     No family history on file. History  Substance Use Topics  . Smoking status: Current Every Day Smoker  . Smokeless tobacco: Not on file  . Alcohol Use: No   OB History    No data available     Review of Systems  Musculoskeletal: Positive for arthralgias and neck pain.  All other systems reviewed and are negative.  Allergies  Penicillins  Home Medications   Prior to Admission medications   Medication Sig Start Date End Date Taking? Authorizing Provider  diazepam (VALIUM) 5 MG tablet Take 1 tablet (5 mg total) by mouth 2 (two) times daily. 09/02/14   Hannah Muthersbaugh, PA-C  HYDROcodone-acetaminophen (NORCO/VICODIN) 5-325 MG per tablet Take 1-2 tablets by mouth every 6 (six) hours as needed for moderate pain or severe pain. 09/02/14   Hannah Muthersbaugh, PA-C  ibuprofen  (ADVIL,MOTRIN) 200 MG tablet Take 400 mg by mouth 2 (two) times daily as needed. For pain    Historical Provider, MD   Triage VS: BP 141/82 mmHg  Pulse 86  Temp(Src) 98 F (36.7 C) (Oral)  Ht 5\' 2"  (1.575 m)  Wt 113 lb (51.256 kg)  BMI 20.66 kg/m2  SpO2 98% Physical Exam  Constitutional: She is oriented to person, place, and time. She appears well-developed and well-nourished. No distress.  HENT:  Head: Normocephalic and atraumatic.  Mouth/Throat: Oropharynx is clear and moist.  Eyes: Conjunctivae and EOM are normal. Pupils are equal, round, and reactive to light.  Neck: Normal range of motion. Neck supple. No tracheal deviation present.  Cardiovascular: Normal rate and normal heart sounds.  Exam reveals no gallop and no friction rub.   No murmur heard. Pulmonary/Chest: Breath sounds normal. No respiratory distress.  Abdominal: Soft. She exhibits no distension. There is no tenderness. There is no rebound and no guarding.  Musculoskeletal: Normal range of motion. She exhibits no edema or tenderness.  No deformity, no swelling. Decreased ROM in right elbow. Normal ROM in right hand.   Neurological: She is alert and oriented to person, place, and time.  Skin: Skin is warm and dry.  Psychiatric: She has a normal mood and affect. Her behavior is normal.  Nursing note and vitals reviewed.   ED Course  Procedures (including critical  care time) DIAGNOSTIC STUDIES: Oxygen Saturation is 98% on RA, normal by my interpretation.    COORDINATION OF CARE: 11:25 AM Discussed normal XR findings and treatment plan with pt at bedside. Pt agreed to plan.   Labs Review Labs Reviewed - No data to display  Imaging Review Dg Shoulder Right  05/31/2015   CLINICAL DATA:  54 year old female with fall from bed. Anterior shoulder pain.  EXAM: RIGHT SHOULDER - 2+ VIEW  COMPARISON:  09/17/2012  FINDINGS: No acute bony abnormality. No significant soft tissue swelling. Degenerative changes of the  acromioclavicular joint. Glenohumeral joint appears congruent.  IMPRESSION: No acute bony abnormality.  Signed,  Yvone Neu. Loreta Ave, DO  [facility]  [facility]   Electronically Signed   By: Gilmer Mor D.O.   On: 05/31/2015 11:08     EKG Interpretation None      MDM   Final diagnoses:  Shoulder pain, acute, right   hydrocodone Sling Pt is followed by [facility].  Pt advised to follow up for a recheck.  Lonia Skinner Sheffield, PA-C 05/31/15 1239  Jerelyn Scott, MD 05/31/15 [phone]

## 2015-05-31 NOTE — ED Notes (Signed)
Declined W/C at D/C and was escorted to lobby by RN. 

## 2015-07-31 ENCOUNTER — Ambulatory Visit: Payer: Self-pay | Admitting: Podiatry

## 2016-03-03 ENCOUNTER — Emergency Department (HOSPITAL_COMMUNITY)
Admission: EM | Admit: 2016-03-03 | Discharge: 2016-03-03 | Disposition: A | Discharge status: Home / Self Care | Payer: Medicaid Other | Attending: Emergency Medicine | Admitting: Emergency Medicine

## 2016-03-03 ENCOUNTER — Encounter (HOSPITAL_COMMUNITY): Payer: Self-pay

## 2016-03-03 ENCOUNTER — Emergency Department (HOSPITAL_COMMUNITY): Payer: Medicaid Other

## 2016-03-03 DIAGNOSIS — Y9389 Activity, other specified: Secondary | ICD-10-CM | POA: Insufficient documentation

## 2016-03-03 DIAGNOSIS — Y92481 Parking lot as the place of occurrence of the external cause: Secondary | ICD-10-CM | POA: Diagnosis not present

## 2016-03-03 DIAGNOSIS — F17200 Nicotine dependence, unspecified, uncomplicated: Secondary | ICD-10-CM | POA: Diagnosis not present

## 2016-03-03 DIAGNOSIS — Z880 Allergy status to penicillin: Secondary | ICD-10-CM | POA: Diagnosis not present

## 2016-03-03 DIAGNOSIS — S79912A Unspecified injury of left hip, initial encounter: Secondary | ICD-10-CM | POA: Diagnosis present

## 2016-03-03 DIAGNOSIS — Z79899 Other long term (current) drug therapy: Secondary | ICD-10-CM | POA: Insufficient documentation

## 2016-03-03 DIAGNOSIS — Y998 Other external cause status: Secondary | ICD-10-CM | POA: Insufficient documentation

## 2016-03-03 DIAGNOSIS — W1839XA Other fall on same level, initial encounter: Secondary | ICD-10-CM | POA: Diagnosis not present

## 2016-03-03 DIAGNOSIS — S7002XA Contusion of left hip, initial encounter: Secondary | ICD-10-CM | POA: Insufficient documentation

## 2016-03-03 DIAGNOSIS — F319 Bipolar disorder, unspecified: Secondary | ICD-10-CM | POA: Diagnosis not present

## 2016-03-03 MED ORDER — OXYCODONE-ACETAMINOPHEN 5-325 MG PO TABS
ORAL_TABLET | ORAL | Status: AC
  Filled 2016-03-03: qty 1

## 2016-03-03 MED ORDER — IBUPROFEN 600 MG PO TABS: 600.0000 mg | ORAL_TABLET | Freq: Four times a day (QID) | ORAL | Status: DC | PRN

## 2016-03-03 MED ORDER — OXYCODONE-ACETAMINOPHEN 5-325 MG PO TABS
1.0000 | ORAL_TABLET | Freq: Once | ORAL | Status: AC
  Administered 2016-03-03: 1 via ORAL

## 2016-03-03 NOTE — ED Notes (Signed)
Patient fell in [employer] parking lot last pm and complains of left hip pain. Pain with any ambulation or change in position

## 2016-03-03 NOTE — Discharge Instructions (Signed)

## 2016-03-03 NOTE — ED Provider Notes (Signed)
CSN: [id number]     Arrival date & time 03/03/16  1246 History   First MD Initiated Contact with Patient 03/03/16 1742     Chief Complaint  Patient presents with  . Fall  . Hip Pain     (Consider location/radiation/quality/duration/timing/severity/associated sxs/prior Treatment) Patient is a 55 y.o. female presenting with fall and hip pain. The history is provided by the patient.  Fall This is a new problem. The current episode started yesterday. The problem occurs constantly. The problem has not changed since onset.Nothing aggravates the symptoms. Nothing relieves the symptoms. She has tried nothing for the symptoms.  Hip Pain This is a new problem. The current episode started yesterday. The problem occurs constantly. The problem has not changed since onset.   Past Medical History  Diagnosis Date  . Bipolar 1 disorder [facility])    Past Surgical History  Procedure Laterality Date  . Ectopic pregnancy surgery     No family history on file. Social History  Substance Use Topics  . Smoking status: Current Every Day Smoker  . Smokeless tobacco: None  . Alcohol Use: No   OB History    No data available     Review of Systems  All other systems reviewed and are negative.     Allergies  Penicillins  Home Medications   Prior to Admission medications   Medication Sig Start Date End Date Taking? Authorizing Provider  diazepam (VALIUM) 5 MG tablet Take 1 tablet (5 mg total) by mouth 2 (two) times daily. 09/02/14   Hannah Muthersbaugh, PA-C  HYDROcodone-acetaminophen (NORCO/VICODIN) 5-325 MG per tablet Take 1-2 tablets by mouth every 6 (six) hours as needed for moderate pain or severe pain. 05/31/15   Elson Areas, PA-C  ibuprofen (ADVIL,MOTRIN) 200 MG tablet Take 400 mg by mouth 2 (two) times daily as needed. For pain    Historical Provider, MD   BP 132/90 mmHg  Pulse 91  Temp(Src) 98.4 F (36.9 C) (Oral)  Resp 16  Ht  (1.575 m)  Wt 102 lb (46.267 kg)  BMI 18.65 kg/m2   SpO2 98% Physical Exam  Constitutional: She is oriented to person, place, and time. She appears well-developed and well-nourished. No distress.  HENT:  Head: Normocephalic.  Eyes: Conjunctivae are normal.  Neck: Neck supple. No tracheal deviation present.  Cardiovascular: Normal rate and regular rhythm.   Pulmonary/Chest: Effort normal. No respiratory distress.  Abdominal: Soft. She exhibits no distension.  Musculoskeletal:  3 cm left lateral hip contusion with extending ecchymosis, point tender, full range of motion without difficulty.  Neurological: She is alert and oriented to person, place, and time.  Skin: Skin is warm and dry.  Psychiatric: She has a normal mood and affect.    ED Course  Procedures (including critical care time) Labs Review Labs Reviewed - No data to display  Imaging Review Dg Hip Unilat With Pelvis 2-3 Views Left  03/03/2016  CLINICAL DATA:  Left hip pain fall, initial encounter EXAM: DG HIP (WITH OR WITHOUT PELVIS) 2-3V LEFT COMPARISON:  None. FINDINGS: Pelvic ring is intact. No acute fracture or dislocation is seen. No gross soft tissue abnormality is noted. IMPRESSION: No acute abnormality seen. Electronically Signed   By: Alcide Clever M.D.   On: 03/03/2016 14:10   I have personally reviewed and evaluated these images and lab results as part of my medical decision-making.   EKG Interpretation None      MDM   Final diagnoses:  Contusion of left hip,  initial encounter    55 year old female presents after fall last night walking out of the grocery store, landing on her left lateral hip. She was able to walk after the injury but woke up today with worsening pain and some bruising over the lateral portion of the hip. Plain film is negative for acute bony injury. Patient is able to ambulate without difficulty in the emergency department. Clinically consistent with uncomplicated contusion. Recommended NSAIDs, aggressive icing and early mobility as definitive  therapy.    Lyndal Pulleyaniel Donya Tomaro, MD 03/03/16 [phone]

## 2016-03-17 ENCOUNTER — Ambulatory Visit: Payer: Medicaid Other | Admitting: Sports Medicine

## 2016-04-01 ENCOUNTER — Ambulatory Visit: Payer: Self-pay

## 2016-04-01 ENCOUNTER — Encounter (INDEPENDENT_AMBULATORY_CARE_PROVIDER_SITE_OTHER): Payer: Medicaid Other | Admitting: Podiatry

## 2016-04-01 DIAGNOSIS — M2012 Hallux valgus (acquired), left foot: Secondary | ICD-10-CM

## 2016-04-01 NOTE — Progress Notes (Signed)
This encounter was created in error - please disregard.

## 2017-01-01 ENCOUNTER — Emergency Department (HOSPITAL_COMMUNITY)
Admission: EM | Admit: 2017-01-01 | Discharge: 2017-01-01 | Discharge status: Against Medical Advice | Payer: Medicaid Other

## 2017-01-01 NOTE — ED Notes (Signed)
Pt left to go pick up child from school. Pt stated she needed a note stating she had been at the hospital. Pt has not yet been triaged. Charge nurse notified of situation.

## 2017-07-08 ENCOUNTER — Emergency Department (HOSPITAL_COMMUNITY): Payer: Medicaid Other

## 2017-07-08 ENCOUNTER — Encounter (HOSPITAL_COMMUNITY): Payer: Self-pay | Admitting: Nurse Practitioner

## 2017-07-08 ENCOUNTER — Emergency Department (HOSPITAL_COMMUNITY)
Admission: EM | Admit: 2017-07-08 | Discharge: 2017-07-08 | Disposition: A | Discharge status: Home / Self Care | Payer: Medicaid Other | Attending: Emergency Medicine | Admitting: Emergency Medicine

## 2017-07-08 DIAGNOSIS — J189 Pneumonia, unspecified organism: Secondary | ICD-10-CM

## 2017-07-08 DIAGNOSIS — R1012 Left upper quadrant pain: Secondary | ICD-10-CM

## 2017-07-08 DIAGNOSIS — F17200 Nicotine dependence, unspecified, uncomplicated: Secondary | ICD-10-CM | POA: Diagnosis not present

## 2017-07-08 DIAGNOSIS — J181 Lobar pneumonia, unspecified organism: Secondary | ICD-10-CM

## 2017-07-08 DIAGNOSIS — J188 Other pneumonia, unspecified organism: Secondary | ICD-10-CM | POA: Diagnosis not present

## 2017-07-08 DIAGNOSIS — R1084 Generalized abdominal pain: Secondary | ICD-10-CM | POA: Diagnosis present

## 2017-07-08 DIAGNOSIS — Z79899 Other long term (current) drug therapy: Secondary | ICD-10-CM | POA: Diagnosis not present

## 2017-07-08 LAB — CBC
HEMATOCRIT: 45 % (ref 36.0–46.0)
Hemoglobin: 14.8 g/dL (ref 12.0–15.0)
MCH: 31.6 pg (ref 26.0–34.0)
MCHC: 32.9 g/dL (ref 30.0–36.0)
MCV: 96.2 fL (ref 78.0–100.0)
Platelets: 323 10*3/uL (ref 150–400)
RBC: 4.68 MIL/uL (ref 3.87–5.11)
RDW: 13.5 % (ref 11.5–15.5)
WBC: 10.2 10*3/uL (ref 4.0–10.5)

## 2017-07-08 LAB — COMPREHENSIVE METABOLIC PANEL
ALT: 105 U/L — AB (ref 14–54)
AST: 22 U/L (ref 15–41)
Albumin: 3.3 g/dL — ABNORMAL LOW (ref 3.5–5.0)
Alkaline Phosphatase: 80 U/L (ref 38–126)
Anion gap: 10 (ref 5–15)
BUN: 9 mg/dL (ref 6–20)
CHLORIDE: 105 mmol/L (ref 101–111)
CO2: 22 mmol/L (ref 22–32)
CREATININE: 0.62 mg/dL (ref 0.44–1.00)
Calcium: 8.5 mg/dL — ABNORMAL LOW (ref 8.9–10.3)
Glucose, Bld: 131 mg/dL — ABNORMAL HIGH (ref 65–99)
POTASSIUM: 3.6 mmol/L (ref 3.5–5.1)
SODIUM: 137 mmol/L (ref 135–145)
Total Bilirubin: 0.4 mg/dL (ref 0.3–1.2)
Total Protein: 6.6 g/dL (ref 6.5–8.1)

## 2017-07-08 LAB — LIPASE, BLOOD: Lipase: 28 U/L (ref 11–51)

## 2017-07-08 MED ORDER — KETOROLAC TROMETHAMINE 30 MG/ML IJ SOLN
30.0000 mg | Freq: Once | INTRAMUSCULAR | Status: AC
  Administered 2017-07-08: 30 mg via INTRAVENOUS
  Filled 2017-07-08: qty 1

## 2017-07-08 MED ORDER — HYDROCODONE-ACETAMINOPHEN 5-325 MG PO TABS: 1.0000 | ORAL_TABLET | Freq: Four times a day (QID) | ORAL | 0 refills | Status: DC | PRN

## 2017-07-08 MED ORDER — AZITHROMYCIN 250 MG PO TABS: ORAL_TABLET | ORAL | 0 refills | Status: DC

## 2017-07-08 MED ORDER — MORPHINE SULFATE (PF) 4 MG/ML IV SOLN
4.0000 mg | Freq: Once | INTRAVENOUS | Status: AC
Start: 2017-07-08 — End: 2017-07-08
  Administered 2017-07-08: 4 mg via INTRAVENOUS
  Filled 2017-07-08: qty 1

## 2017-07-08 MED ORDER — ONDANSETRON HCL 4 MG/2ML IJ SOLN
4.0000 mg | Freq: Once | INTRAMUSCULAR | Status: AC
  Administered 2017-07-08: 4 mg via INTRAVENOUS
  Filled 2017-07-08: qty 2

## 2017-07-08 NOTE — ED Provider Notes (Signed)
[facility]EMER[facility]ENCY DEPT Provider Note   CSN: [id number] Arrival date & time: 07/08/17  1239     History   Chief Complaint Chief Complaint  Patient presents with  . Abdominal Pain    HPI Kimberly Henson is a 56 y.o. female.  Patient is a 56 year old female with past medical history of bipolar disorder presenting with complaints of left-sided abdominal and flank pain that started yesterday. The pain came on suddenly and has been severe ever since. She denies any fevers or chills. She denies any urinary complaints. She denies any constipation.   The history is provided by the patient.  Abdominal Pain   This is a new problem. The current episode started yesterday. The problem occurs constantly. The problem has not changed since onset.The pain is associated with an unknown factor. The pain is located in the LUQ and LLQ (Left flank). Quality: Stabbing. The pain is severe. Pertinent negatives include fever. Nothing aggravates the symptoms. Nothing relieves the symptoms.    Past Medical History:  Diagnosis Date  . Bipolar 1 disorder [facility])     Patient Active Problem List   Diagnosis Date Noted  . WART 09/20/2010  . LEG PAIN 05/29/2010  . MUSCLE SPASM, BACK 04/04/2010  . KNEE PAIN, RIGHT 03/27/2010  . TENSION HEADACHE 01/31/2009  . GERD 11/13/2008  . URI 06/06/2008  . 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 OBSESSIVE-COMPULSIVE DISORDER 05/03/2007  . 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 SYNDROME, RESTLESS LEGS 05/03/2007  . MIGRAINE HEADACHE 05/03/2007  . RHINITIS, ALLERGIC NOS 05/03/2007  . ECTOPIC PREGNANCY 05/03/2007    Past Surgical History:  Procedure Laterality Date  . ECTOPIC PREGNANCY SURGERY      OB History    No data available       Home Medications    Prior to Admission medications   Medication Sig Start Date End Date Taking? Authorizing Provider  diazepam (VALIUM) 5 MG tablet Take 1 tablet (5 mg total) by mouth 2 (two) times daily.  09/02/14   Muthersbaugh, Dahlia Client, PA-C  HYDROcodone-acetaminophen (NORCO/VICODIN) 5-325 MG per tablet Take 1-2 tablets by mouth every 6 (six) hours as needed for moderate pain or severe pain. 05/31/15   Elson Areas, PA-C  ibuprofen (ADVIL,MOTRIN) 600 MG tablet Take 1 tablet (600 mg total) by mouth every 6 (six) hours as needed for moderate pain. For pain 03/03/16   Lyndal Pulley, MD    Family History History reviewed. No pertinent family history.  Social History Social History  Substance Use Topics  . Smoking status: Current Every Day Smoker  . Smokeless tobacco: Not on file  . Alcohol use No     Allergies   Penicillins   Review of Systems Review of Systems  Constitutional: Negative for fever.  Gastrointestinal: Positive for abdominal pain.  All other systems reviewed and are negative.    Physical Exam Updated Vital Signs BP 113/83 (BP Location: Right Arm)   Pulse 80   Temp 98.2 F (36.8 C) (Oral)   Resp 16   Ht 5\' 2"  (1.575 m)   Wt 49.9 kg (110 lb)   SpO2 94%   BMI 20.12 kg/m   Physical Exam  Constitutional: She is oriented to person, place, and time. She appears well-developed and well-nourished. No distress.  HENT:  Head: Normocephalic and atraumatic.  Neck: Normal range of motion. Neck supple.  Cardiovascular: Normal rate and regular rhythm.  Exam reveals no gallop and no friction rub.   No murmur heard.  Pulmonary/Chest: Effort normal and breath sounds normal. No respiratory distress. She has no wheezes.  Abdominal: Soft. Bowel sounds are normal. She exhibits no distension. There is tenderness. There is no rebound and no guarding.  There is tenderness to palpation in the left upper quadrant, left lower quadrant, and left flank.  Musculoskeletal: Normal range of motion.  Neurological: She is alert and oriented to person, place, and time.  Skin: Skin is warm and dry. She is not diaphoretic.  Nursing note and vitals reviewed.    ED Treatments / Results   Labs (all labs ordered are listed, but only abnormal results are displayed) Labs Reviewed  CBC  COMPREHENSIVE METABOLIC PANEL  URINALYSIS, ROUTINE W REFLEX MICROSCOPIC  LIPASE, BLOOD    EKG  EKG Interpretation None       Radiology No results found.  Procedures Procedures (including critical care time)  Medications Ordered in ED Medications  ketorolac (TORADOL) 30 MG/ML injection 30 mg (not administered)  morphine 4 MG/ML injection 4 mg (not administered)  ondansetron (ZOFRAN) injection 4 mg (not administered)     Initial Impression / Assessment and Plan / ED Course  I have reviewed the triage vital signs and the nursing notes.  Pertinent labs & imaging results that were available during my care of the patient were reviewed by me and considered in my medical decision making (see chart for details).  Patient presents with left upper abdominal pain, cough for the past several days. Her workup reveals no leukocytosis, no fever, no hypoxia, and CT scan of the abdomen is negative for acute intra-abdominal pathology. It does however reveal what appears to be an infiltrate in the left base. She will be treated with antibiotics, medicine for cough/pain, and follow-up as needed.  Final Clinical Impressions(s) / ED Diagnoses   Final diagnoses:  None    New Prescriptions New Prescriptions   No medications on file     Geoffery Lyonselo, Bernice Mullin, MD 07/08/17 1533

## 2017-07-08 NOTE — Discharge Instructions (Signed)
Zithromax as prescribed.  Hydrocodone as prescribed as needed for pain/cough.  Return to the emergency department for worsening pain, difficulty breathing, or other new and concerning symptoms.
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
## 2017-07-23 ENCOUNTER — Emergency Department (HOSPITAL_COMMUNITY)
Admission: EM | Admit: 2017-07-23 | Discharge: 2017-07-23 | Disposition: A | Discharge status: Home / Self Care | Payer: Medicaid Other | Attending: Emergency Medicine | Admitting: Emergency Medicine

## 2017-07-23 ENCOUNTER — Encounter (HOSPITAL_COMMUNITY): Payer: Self-pay | Admitting: Emergency Medicine

## 2017-07-23 ENCOUNTER — Emergency Department (HOSPITAL_COMMUNITY): Payer: Medicaid Other

## 2017-07-23 DIAGNOSIS — R4182 Altered mental status, unspecified: Secondary | ICD-10-CM

## 2017-07-23 DIAGNOSIS — F17200 Nicotine dependence, unspecified, uncomplicated: Secondary | ICD-10-CM | POA: Insufficient documentation

## 2017-07-23 DIAGNOSIS — T50901A Poisoning by unspecified drugs, medicaments and biological substances, accidental (unintentional), initial encounter: Secondary | ICD-10-CM | POA: Insufficient documentation

## 2017-07-23 DIAGNOSIS — F1910 Other psychoactive substance abuse, uncomplicated: Secondary | ICD-10-CM

## 2017-07-23 DIAGNOSIS — Z79899 Other long term (current) drug therapy: Secondary | ICD-10-CM | POA: Insufficient documentation

## 2017-07-23 LAB — CBC WITH DIFFERENTIAL/PLATELET
Basophils Absolute: 0 10*3/uL (ref 0.0–0.1)
Basophils Relative: 0 %
EOS ABS: 0 10*3/uL (ref 0.0–0.7)
EOS PCT: 0 %
HCT: 46.6 % — ABNORMAL HIGH (ref 36.0–46.0)
Hemoglobin: 14.5 g/dL (ref 12.0–15.0)
LYMPHS ABS: 0.8 10*3/uL (ref 0.7–4.0)
LYMPHS PCT: 6 %
MCH: 30.5 pg (ref 26.0–34.0)
MCHC: 31.1 g/dL (ref 30.0–36.0)
MCV: 98.1 fL (ref 78.0–100.0)
MONO ABS: 0.3 10*3/uL (ref 0.1–1.0)
MONOS PCT: 2 %
Neutro Abs: 13.6 10*3/uL — ABNORMAL HIGH (ref 1.7–7.7)
Neutrophils Relative %: 92 %
PLATELETS: 280 10*3/uL (ref 150–400)
RBC: 4.75 MIL/uL (ref 3.87–5.11)
RDW: 13.6 % (ref 11.5–15.5)
WBC: 14.8 10*3/uL — AB (ref 4.0–10.5)

## 2017-07-23 LAB — RAPID URINE DRUG SCREEN, HOSP PERFORMED
AMPHETAMINES: POSITIVE — AB
BENZODIAZEPINES: POSITIVE — AB
Barbiturates: NOT DETECTED
Cocaine: POSITIVE — AB
OPIATES: NOT DETECTED
Tetrahydrocannabinol: POSITIVE — AB

## 2017-07-23 LAB — COMPREHENSIVE METABOLIC PANEL
ALT: 31 U/L (ref 14–54)
AST: 42 U/L — ABNORMAL HIGH (ref 15–41)
Albumin: 3.9 g/dL (ref 3.5–5.0)
Alkaline Phosphatase: 78 U/L (ref 38–126)
Anion gap: 13 (ref 5–15)
BUN: 10 mg/dL (ref 6–20)
CHLORIDE: 104 mmol/L (ref 101–111)
CO2: 20 mmol/L — AB (ref 22–32)
Calcium: 8.8 mg/dL — ABNORMAL LOW (ref 8.9–10.3)
Creatinine, Ser: 0.83 mg/dL (ref 0.44–1.00)
GFR calc non Af Amer: >60 mL/min (ref >60)
Glucose, Bld: 195 mg/dL — ABNORMAL HIGH (ref 65–99)
Potassium: 3.6 mmol/L (ref 3.5–5.1)
SODIUM: 137 mmol/L (ref 135–145)
Total Bilirubin: 0.7 mg/dL (ref 0.3–1.2)
Total Protein: 7 g/dL (ref 6.5–8.1)

## 2017-07-23 LAB — ACETAMINOPHEN LEVEL

## 2017-07-23 LAB — SALICYLATE LEVEL: Salicylate Lvl: <7 mg/dL (ref 2.8–30.0)

## 2017-07-23 LAB — ETHANOL: Alcohol, Ethyl (B): 46 mg/dL — ABNORMAL HIGH (ref <5)

## 2017-07-23 MED ORDER — ONDANSETRON HCL 4 MG/2ML IJ SOLN
4.0000 mg | Freq: Once | INTRAMUSCULAR | Status: AC
  Administered 2017-07-23: 4 mg via INTRAVENOUS
  Filled 2017-07-23: qty 2

## 2017-07-23 MED ORDER — SODIUM CHLORIDE 0.9 % IV BOLUS (SEPSIS)
1000.0000 mL | Freq: Once | INTRAVENOUS | Status: AC
  Administered 2017-07-23: 1000 mL via INTRAVENOUS

## 2017-07-23 NOTE — ED Provider Notes (Signed)
I received this patient in signout from Dr.Liu. She had presented with altered mental status, concern for possible accidental overdose and she had admitted to tasting and smelling a "white substance." She had initially responded to Narcan. Observe the patient for several hours while we were awaiting lab work. Labs overall reassuring, WBC 14.8 but the remainder of her lab work was unremarkable. Her UDS was positive for cocaine, benzos, amphetamines, and THC. I suspect polysubstance abuse may have been driving her altered mentation. On a few evaluations, the patient was sleeping, I received one report from nursing staff that she had vomited and I gave her nausea medication. Later I checked on her and she was wide awake, agitated stating that she wanted to go home and that the only reason she threw up was because she was so thirsty and drank an entire cup of water. Given her normal mental status on reexamination, I feel she is safe for discharge. Patient discharged in satisfactory condition.   Little, Ambrose Finlandachel Morgan, MD 07/23/17 [phone]
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
## 2017-07-23 NOTE — ED Triage Notes (Addendum)
Pt arrives from home via [facility] reporting call out for unresponsiveness d/t drug overdose.  EMS reports pt received 2 mg Narcan intranasally, was bagged for a few minutes initially. EMS reports pt pupils were initially pinpoint, increased to 3 PERRL with pt responding to voice.  Resp e/u on arrival, NAD noted.

## 2017-07-23 NOTE — ED Notes (Signed)
Pt satting at 88% on RA while sleeping, refuses to tolerate [place].

## 2017-07-23 NOTE — ED Notes (Signed)
ED Provider at bedside. 

## 2017-07-23 NOTE — ED Notes (Signed)
Pt stable, ambulatory, states understanding of discharge instructions 

## 2017-07-23 NOTE — ED Notes (Signed)
Pt given water and crackers per her request

## 2017-08-21 ENCOUNTER — Encounter (HOSPITAL_COMMUNITY): Payer: Self-pay | Admitting: Emergency Medicine

## 2017-08-21 ENCOUNTER — Emergency Department (HOSPITAL_COMMUNITY)
Admission: EM | Admit: 2017-08-21 | Discharge: 2017-08-22 | Discharge status: Against Medical Advice | Payer: Medicaid Other | Attending: Emergency Medicine | Admitting: Emergency Medicine

## 2017-08-21 DIAGNOSIS — Z5321 Procedure and treatment not carried out due to patient leaving prior to being seen by health care provider: Secondary | ICD-10-CM | POA: Insufficient documentation

## 2017-08-21 DIAGNOSIS — M79672 Pain in left foot: Secondary | ICD-10-CM | POA: Diagnosis present

## 2017-08-21 NOTE — ED Triage Notes (Signed)
Pt reports L foot numbness/stabbing pain ongoing since yesterday at 11AM. Denies injury. Described as a tingling sensation. Difficulty with ambulation.

## 2017-08-21 NOTE — ED Notes (Signed)
Pts name called for a room no answer 

## 2017-08-21 NOTE — ED Triage Notes (Signed)
CMS intact, good equal strength

## 2017-11-27 ENCOUNTER — Emergency Department (HOSPITAL_COMMUNITY)
Admission: EM | Admit: 2017-11-27 | Discharge: 2017-11-27 | Discharge status: Against Medical Advice | Payer: Medicaid Other | Attending: Emergency Medicine | Admitting: Emergency Medicine

## 2017-11-27 ENCOUNTER — Encounter (HOSPITAL_COMMUNITY): Payer: Self-pay | Admitting: *Deleted

## 2017-11-27 ENCOUNTER — Emergency Department (HOSPITAL_COMMUNITY): Payer: Medicaid Other

## 2017-11-27 DIAGNOSIS — F419 Anxiety disorder, unspecified: Secondary | ICD-10-CM | POA: Diagnosis not present

## 2017-11-27 DIAGNOSIS — G8929 Other chronic pain: Secondary | ICD-10-CM | POA: Diagnosis not present

## 2017-11-27 DIAGNOSIS — Z5329 Procedure and treatment not carried out because of patient's decision for other reasons: Secondary | ICD-10-CM | POA: Insufficient documentation

## 2017-11-27 DIAGNOSIS — W19XXXS Unspecified fall, sequela: Secondary | ICD-10-CM | POA: Diagnosis not present

## 2017-11-27 DIAGNOSIS — M25561 Pain in right knee: Secondary | ICD-10-CM | POA: Diagnosis present

## 2017-11-27 DIAGNOSIS — F17210 Nicotine dependence, cigarettes, uncomplicated: Secondary | ICD-10-CM | POA: Insufficient documentation

## 2017-11-27 DIAGNOSIS — R454 Irritability and anger: Secondary | ICD-10-CM | POA: Insufficient documentation

## 2017-11-27 DIAGNOSIS — Z79899 Other long term (current) drug therapy: Secondary | ICD-10-CM | POA: Diagnosis not present

## 2017-11-27 DIAGNOSIS — M25461 Effusion, right knee: Secondary | ICD-10-CM | POA: Insufficient documentation

## 2017-11-27 LAB — SYNOVIAL CELL COUNT + DIFF, W/ CRYSTALS
Crystals, Fluid: NONE SEEN
EOSINOPHILS-SYNOVIAL: 0 % (ref 0–1)
LYMPHOCYTES-SYNOVIAL FLD: 21 % — AB (ref 0–20)
MONOCYTE-MACROPHAGE-SYNOVIAL FLUID: 74 % (ref 50–90)
NEUTROPHIL, SYNOVIAL: 5 % (ref 0–25)
WBC, SYNOVIAL: 285 /mm3 — AB (ref 0–200)

## 2017-11-27 LAB — GRAM STAIN: Gram Stain: NONE SEEN

## 2017-11-27 MED ORDER — HYDROCODONE-ACETAMINOPHEN 5-325 MG PO TABS
1.0000 | ORAL_TABLET | Freq: Once | ORAL | Status: AC
  Administered 2017-11-27: 1 via ORAL
  Filled 2017-11-27: qty 1

## 2017-11-27 MED ORDER — LORAZEPAM 1 MG PO TABS
1.0000 mg | ORAL_TABLET | Freq: Once | ORAL | Status: AC
  Administered 2017-11-27: 1 mg via ORAL
  Filled 2017-11-27: qty 1

## 2017-11-27 MED ORDER — LIDOCAINE-EPINEPHRINE (PF) 2 %-1:200000 IJ SOLN
20.0000 mL | Freq: Once | INTRAMUSCULAR | Status: AC
  Administered 2017-11-27: 20 mL via INTRADERMAL
  Filled 2017-11-27: qty 20

## 2017-11-27 NOTE — ED Provider Notes (Cosign Needed)
Right Knee Arthrocentesis:   After verbal consent was obtained, using sterile technique the right knee was prepped and plain Lidocaine 1% was used as local anesthetic. The joint was entered and 34 ml's of clear yellow colored fluid was withdrawn and sent for studies.  The procedure was well tolerated.  The patient is asked to continue to rest the joint for a few more days before resuming regular activities. Watch for fever, or increased swelling or persistent pain in the joint. Return to ED if such symptoms occur or there is failure to improve as anticipated.  Shirlyn GoltzEmily Shrosbree, PA present during procedure  Palma HolterKanishka G Marlaina Coburn, MD PGY 3 Family Medicine    Palma HolterGunadasa, Humaira Sculley G, MD 11/27/17 1540

## 2017-11-27 NOTE — ED Notes (Signed)
Patient requesting bathroom use. Patient assisted to restroom

## 2017-11-27 NOTE — ED Notes (Signed)
EDP at bedside  

## 2017-11-27 NOTE — ED Notes (Signed)
Edp made aware patient requesting pain medicine. Edp advised patient that we administered pain medication while she was here and patient will not be prescribed any further medicine after leaving AMA.

## 2017-11-27 NOTE — ED Notes (Signed)
Patient originally signed wrong discharge form. Patient then signed appropriate AMA form.

## 2017-11-27 NOTE — ED Notes (Signed)
Patient continuing to remain tearful in pain. Will notify edp

## 2017-11-27 NOTE — ED Notes (Signed)
EDP aware of pain. EDP and resident at bedside to complete procedure, along with numbing medication

## 2017-11-27 NOTE — ED Provider Notes (Signed)
[facility] EMERGENCY DEPARTMENT Provider Note   CSN: [id number] Arrival date & time: 11/27/17  1224     History   Chief Complaint Chief Complaint  Patient presents with  . Knee Pain    HPI RICHARD HOLZ is a 56 y.o. female.  HPI  Ms. Durrett is a 56yo female with a history of OCD, bipolar 1 disorder, migraines who presents emergency department for evaluation of right knee pain.  Patient states that she fell on her right knee 8 months ago.  She was never evaluated, but has had worsening swelling and pain in that knee ever since.  States that pain is 10/10 in severity, constant and "throbbing."  She has tried ibuprofen and Tylenol for the pain without significant relief.  She states that she is able to walk on the right leg, although slowly.  Flexing the knee and ambulation worsen her pain.  She denies recent injury.  Denies redness, fever, numbness. Denies popping of the knee joint or instability. Is able to ambulate independently despite pain.   Past Medical History:  Diagnosis Date  . Bipolar 1 disorder [facility])     Patient Active Problem List   Diagnosis Date Noted  . WART 09/20/2010  . LEG PAIN 05/29/2010  . MUSCLE SPASM, BACK 04/04/2010  . KNEE PAIN, RIGHT 03/27/2010  . TENSION HEADACHE 01/31/2009  . GERD 11/13/2008  . URI 06/06/2008  . 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 OBSESSIVE-COMPULSIVE DISORDER 05/03/2007  . 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 SYNDROME, RESTLESS LEGS 05/03/2007  . MIGRAINE HEADACHE 05/03/2007  . RHINITIS, ALLERGIC NOS 05/03/2007  . ECTOPIC PREGNANCY 05/03/2007    Past Surgical History:  Procedure Laterality Date  . ECTOPIC PREGNANCY SURGERY      OB History    No data available       Home Medications    Prior to Admission medications   Medication Sig Start Date End Date Taking? Authorizing Provider  azithromycin (ZITHROMAX Z-PAK) 250 MG tablet 2 po day one, then 1 daily x 4 days  07/08/17   Geoffery Lyons, MD  diazepam (VALIUM) 5 MG tablet Take 1 tablet (5 mg total) by mouth 2 (two) times daily. 09/02/14   Muthersbaugh, Dahlia Client, PA-C  HYDROcodone-acetaminophen (NORCO) 5-325 MG tablet Take 1-2 tablets by mouth every 6 (six) hours as needed. 07/08/17   Geoffery Lyons, MD  ibuprofen (ADVIL,MOTRIN) 600 MG tablet Take 1 tablet (600 mg total) by mouth every 6 (six) hours as needed for moderate pain. For pain 03/03/16   Lyndal Pulley, MD  silver sulfADIAZINE (SILVADENE) 1 % cream as directed. 07/02/17   [provider]    Family History History reviewed. No pertinent family history.  Social History Social History   Tobacco Use  . Smoking status: Current Every Day Smoker    Packs/day: 0.50    Types: Cigarettes  . Smokeless tobacco: Never Used  Substance Use Topics  . Alcohol use: Yes  . Drug use: Yes     Allergies   Penicillins   Review of Systems Review of Systems  Constitutional: Negative for fever.  Musculoskeletal: Positive for arthralgias (right knee) and joint swelling. Negative for gait problem.  Skin: Negative for color change and rash.  Neurological: Negative for weakness and numbness.  Psychiatric/Behavioral: Positive for agitation. The patient is nervous/anxious.      Physical Exam Updated Vital Signs BP 100/69 (BP Location: Left Arm)   Pulse 83   Temp (!) 97.5 F (  36.4 C) (Oral)   Resp 16   SpO2 100%   Physical Exam  Constitutional: She is oriented to person, place, and time. She appears well-developed and well-nourished. No distress.  Patient is screaming out in pain during the entire encounter.  She is tearful and difficult to understand through her cries.   HENT:  Head: Normocephalic and atraumatic.  Eyes: Right eye exhibits no discharge. Left eye exhibits no discharge.  Pulmonary/Chest: Effort normal. No respiratory distress.  Musculoskeletal:  Right knee grossly tender to palpation. Full active flexion and extension. Large joint  effusion and swelling appreciated. No abnormal alignment or patellar mobility. No bruising, erythema or warmth overlaying the joint. No varus/valgus laxity. Negative drawer's, Lachman's and McMurray's.  No crepitus. 2+ DP pulses bilaterally. All compartments are soft. Sensation intact distal to injury.   Neurological: She is alert and oriented to person, place, and time. Coordination normal.  Skin: Skin is warm and dry. Capillary refill takes less than 2 seconds. She is not diaphoretic.  Psychiatric: She has a normal mood and affect. Her behavior is normal.  Nursing note and vitals reviewed.   ED Treatments / Results  Labs (all labs ordered are listed, but only abnormal results are displayed) Labs Reviewed  BODY FLUID CULTURE  GRAM STAIN  SYNOVIAL CELL COUNT + DIFF, W/ CRYSTALS  GLUCOSE, BODY FLUID OTHER    EKG  EKG Interpretation None       Radiology Dg Knee Complete 4 Views Right  Result Date: 11/27/2017 CLINICAL DATA:  Right knee pain and swelling for 6 months. No known injury. EXAM: RIGHT KNEE - COMPLETE 4+ VIEW COMPARISON:  None. FINDINGS: The patient has a large joint effusion. No acute bony or joint abnormality is identified. Joint spaces are preserved with mild osteophytosis seen about the knee. IMPRESSION: Large joint effusion without underlying acute bony or joint abnormality. Mild osteoarthritis. Electronically Signed   By: Drusilla Kannerhomas  Dalessio M.D.   On: 11/27/2017 13:23    Procedures Procedures (including critical care time)  Medications Ordered in ED Medications  lidocaine-EPINEPHrine (XYLOCAINE W/EPI) 2 %-1:200000 (PF) injection 20 mL (not administered)  LORazepam (ATIVAN) tablet 1 mg (1 mg Oral Given 11/27/17 1433)     Initial Impression / Assessment and Plan / ED Course  I have reviewed the triage vital signs and the nursing notes.  Pertinent labs & imaging results that were available during my care of the patient were reviewed by me and considered  in my medical decision making (see chart for details).  Clinical Course as of Nov 30 2331  Fri Nov 27, 2017  1539 After tap, patient states that she wants to leave AMA. Is not interested in the joint fluid results.   [ES]  [id number] Patient requesting specifically for opioid pain medication prescription. After explaining that we do not prescribe opioid narcotics for chronic pain she threatens "I'll just get some fentanyl off the street then." She continues "heroin addicts get pain medicine from the ER all the time, but nobody will give me any."   [ES]    Clinical Course User Index [ES] Kellie ShropshireShrosbree, Sheilia Reznick J, PA-C   Patient presents with right knee pain and swelling from an injury 8 months ago.  She denies recent injury.  X-ray of the right knee shows no fracture, although large joint effusion present. No erythema, induration, warmth or signs of infection of the knee on exam.  No joint laxity to suggest injury to ACL, PCL, MCL or LCL. 34mL of yellow, clear fluid  tapped from the joint and sent for fluid analysis. Arthrocentesis was performed by resident Dr. Ottie GlazierGunadasa.    After arthrocentesis, the patient began yelling at nursing staff that she wants an opioid pain medication to go home with.  Discussed with her that we do not give patient's prescription for opioids to treat chronic pain. Patient frustrated, yelling (see above.)  Have counseled her to follow-up with bone doctor.  Patient states that she would like to leave against medical advice prior to fluid study results.  She agrees to leave AMA.  Vital signs stable.  Final Clinical Impressions(s) / ED Diagnoses   Final diagnoses:  Chronic pain of right knee    ED Discharge Orders    None       Lawrence MarseillesShrosbree, Kerina Simoneau J, PA-C 11/29/17 2337    Mancel BaleWentz, Elliott, MD 11/30/17 [id number]

## 2017-11-27 NOTE — ED Triage Notes (Addendum)
Pt reports falling 2 weeks ago and having  right knee pain and swelling since.

## 2017-11-27 NOTE — ED Notes (Signed)
Patient refusing to stay and wait for cultures of synovial fluid, patient requesting to leave

## 2017-11-27 NOTE — ED Notes (Signed)
Ice bag given to patient. 

## 2017-11-27 NOTE — ED Notes (Signed)
Warm washcloth given to patient

## 2017-11-27 NOTE — ED Notes (Signed)
Patient transported to X-ray 

## 2017-11-27 NOTE — ED Notes (Signed)
Patient requesting pain medicine prescription. Will notify edp

## 2017-11-27 NOTE — ED Notes (Signed)
Patient complaining of increased pain. Will notify edp

## 2017-11-27 NOTE — ED Notes (Signed)
EDP aware patient is tearful and complaining of pain

## 2017-11-27 NOTE — Discharge Instructions (Signed)
Please take ibuprofen 600mg  every 6 hours as needed for pain.  The x-ray of your knee showed no fracture.  It is possible that you have an injury in the meniscus which is causing you to have fluid build up in the knee joint.   I have listed the information to the orthopedic doctor.  Please schedule an appointment for follow-up of your ongoing knee pain.   Return to the emergency department if you develop redness in the knee, fever greater than 100.4 F.  Please also return for any new or worsening symptoms.

## 2017-11-28 LAB — GLUCOSE, BODY FLUID OTHER: GLUCOSE, BODY FLUID OTHER: 144 mg/dL

## 2017-12-02 LAB — CULTURE, BODY FLUID W GRAM STAIN -BOTTLE

## 2017-12-02 LAB — CULTURE, BODY FLUID-BOTTLE: CULTURE: NO GROWTH

## 2018-01-30 ENCOUNTER — Emergency Department (HOSPITAL_COMMUNITY)
Admission: EM | Admit: 2018-01-30 | Discharge: 2018-01-30 | Disposition: A | Discharge status: Home / Self Care | Payer: Medicaid Other | Attending: Emergency Medicine | Admitting: Emergency Medicine

## 2018-01-30 DIAGNOSIS — Z79899 Other long term (current) drug therapy: Secondary | ICD-10-CM | POA: Diagnosis not present

## 2018-01-30 DIAGNOSIS — F17210 Nicotine dependence, cigarettes, uncomplicated: Secondary | ICD-10-CM | POA: Diagnosis not present

## 2018-01-30 DIAGNOSIS — M25561 Pain in right knee: Secondary | ICD-10-CM | POA: Diagnosis not present

## 2018-01-30 DIAGNOSIS — G8929 Other chronic pain: Secondary | ICD-10-CM | POA: Diagnosis not present

## 2018-01-30 MED ORDER — OXYCODONE-ACETAMINOPHEN 5-325 MG PO TABS
1.0000 | ORAL_TABLET | ORAL | Status: DC | PRN
  Administered 2018-01-30: 1 via ORAL
  Filled 2018-01-30: qty 1

## 2018-01-30 MED ORDER — DICLOFENAC SODIUM 1 % TD GEL: 4.0000 g | Freq: Four times a day (QID) | TRANSDERMAL | 0 refills | Status: DC | PRN

## 2018-01-30 NOTE — ED Notes (Signed)
Pt refusing to sign for discharge paper work or receive discharge vital signs. Attempted to review discharge instructions with patient and prescription. Pt assisted with wheelchair to lobby, ambulatory with steady gait.
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
## 2018-01-30 NOTE — Discharge Instructions (Signed)
It was my pleasure taking care of you today!   Use prescription gel as needed for pain. You can also alternate between Tylenol and Ibuprofen.  Ice and elevate knee throughout the day.  Call the orthopedist listed today or tomorrow to schedule follow up appointment.  Return to the ER for new or worsening symptoms, any additional concerns.
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
## 2018-03-18 ENCOUNTER — Ambulatory Visit: Payer: Self-pay | Admitting: Podiatry

## 2018-03-18 ENCOUNTER — Encounter (HOSPITAL_BASED_OUTPATIENT_CLINIC_OR_DEPARTMENT_OTHER): Payer: Self-pay | Admitting: *Deleted

## 2018-03-18 ENCOUNTER — Other Ambulatory Visit: Payer: Self-pay | Admitting: Orthopedic Surgery

## 2018-03-19 ENCOUNTER — Other Ambulatory Visit: Payer: Self-pay | Admitting: Orthopedic Surgery

## 2018-03-19 DIAGNOSIS — S83241A Other tear of medial meniscus, current injury, right knee, initial encounter: Secondary | ICD-10-CM | POA: Diagnosis present

## 2018-03-19 NOTE — H&P (Signed)
Kimberly Henson is an 57 y.o. female.    Chief Complaint: Right Knee Pain  HPI: Kimberly Henson is here today for follow-up on right knee pain.  She was last seen on 02/04/18 at which time there was concern for a meniscal tear.  She was referred for an MRI and she is here today to review the results.  She continues noticed pain and swelling.  She denies any injuries.  She denies any fevers chills night sweats or other signs of infection.  Past Medical History:  Diagnosis Date  . Bipolar 1 disorder [facility](HCC)     Past Surgical History:  Procedure Laterality Date  . ECTOPIC PREGNANCY SURGERY      History reviewed. No pertinent family history. Social History:  reports that she has been smoking cigarettes.  She has been smoking about 0.50 packs per day. She has never used smokeless tobacco. She reports that she drinks alcohol. She reports that she has current or past drug history.  Allergies:  Allergies  Allergen Reactions  . Penicillins Itching    No medications prior to admission.    No results found for this or any previous visit (from the past 48 hour(s)). No results found.  Review of Systems  Constitutional: Negative.   HENT: Negative.   Eyes: Negative.   Respiratory: Negative.   Gastrointestinal: Negative.   Genitourinary: Negative.   Musculoskeletal: Positive for joint pain.  Skin: Negative.   Neurological: Negative.   Psychiatric/Behavioral: The patient is nervous/anxious.     Height 5\' 2"  (1.575 m), weight 49 kg (108 lb). Physical Exam  Constitutional: She is oriented to person, place, and time. She appears well-developed and well-nourished.  HENT:  Head: Normocephalic and atraumatic.  Eyes: Pupils are equal, round, and reactive to light.  Neck: Neck supple.  Cardiovascular: Intact distal pulses.  Respiratory: Effort normal.  Musculoskeletal: She exhibits tenderness.  the patient continues to have pain over the medial lateral joint lines.  No instability.  She does have a mild  effusion.  She has good motion from 0-120 but she does have increased pain with extremes of motion.  Calves are soft and nontender.  Neurological: She is alert and oriented to person, place, and time.  Skin: Skin is warm and dry.  Psychiatric: She has a normal mood and affect. Her behavior is normal. Judgment and thought content normal.     Assessment/Plan  Assess: Right knee pain with both medial and lateral meniscal tears as well as chondromalacia  Plan: Treatment options are once can discussed.  Today, she wishes to proceed with a right knee arthroscopy.  The benefits risks and potential competitions of this procedure discussed.  She wishes to proceed.   She is asked to call with any issues.  Went to spacing this patient back at the time of surgical intervention.  Dannielle BurnEric Vitali Seibert, PA-C 03/19/2018, 11:30 AM

## 2018-03-24 ENCOUNTER — Encounter (HOSPITAL_BASED_OUTPATIENT_CLINIC_OR_DEPARTMENT_OTHER): Payer: Self-pay | Admitting: Anesthesiology

## 2018-03-24 ENCOUNTER — Ambulatory Visit (HOSPITAL_BASED_OUTPATIENT_CLINIC_OR_DEPARTMENT_OTHER): Payer: Medicaid Other | Admitting: Anesthesiology

## 2018-03-24 ENCOUNTER — Ambulatory Visit (HOSPITAL_BASED_OUTPATIENT_CLINIC_OR_DEPARTMENT_OTHER)
Admission: RE | Admit: 2018-03-24 | Discharge: 2018-03-24 | Disposition: A | Discharge status: Home / Self Care | Payer: Medicaid Other | Source: Ambulatory Visit | Attending: Orthopedic Surgery | Admitting: Orthopedic Surgery

## 2018-03-24 ENCOUNTER — Encounter (HOSPITAL_BASED_OUTPATIENT_CLINIC_OR_DEPARTMENT_OTHER)
Admission: RE | Disposition: A | Discharge status: Home / Self Care | Payer: Self-pay | Source: Ambulatory Visit | Attending: Orthopedic Surgery

## 2018-03-24 ENCOUNTER — Other Ambulatory Visit: Payer: Self-pay

## 2018-03-24 DIAGNOSIS — F17210 Nicotine dependence, cigarettes, uncomplicated: Secondary | ICD-10-CM | POA: Insufficient documentation

## 2018-03-24 DIAGNOSIS — F319 Bipolar disorder, unspecified: Secondary | ICD-10-CM | POA: Diagnosis not present

## 2018-03-24 DIAGNOSIS — Z79899 Other long term (current) drug therapy: Secondary | ICD-10-CM | POA: Diagnosis not present

## 2018-03-24 DIAGNOSIS — S83231A Complex tear of medial meniscus, current injury, right knee, initial encounter: Secondary | ICD-10-CM | POA: Insufficient documentation

## 2018-03-24 DIAGNOSIS — X58XXXA Exposure to other specified factors, initial encounter: Secondary | ICD-10-CM | POA: Insufficient documentation

## 2018-03-24 DIAGNOSIS — S83271A Complex tear of lateral meniscus, current injury, right knee, initial encounter: Secondary | ICD-10-CM | POA: Diagnosis not present

## 2018-03-24 DIAGNOSIS — S83241A Other tear of medial meniscus, current injury, right knee, initial encounter: Secondary | ICD-10-CM | POA: Diagnosis present

## 2018-03-24 HISTORY — PX: KNEE ARTHROSCOPY WITH MEDIAL MENISECTOMY: SHX5651

## 2018-03-24 HISTORY — PX: CHONDROPLASTY: SHX5177

## 2018-03-24 HISTORY — PX: KNEE ARTHROSCOPY WITH LATERAL MENISECTOMY: SHX6193

## 2018-03-24 SURGERY — ARTHROSCOPY, KNEE, WITH MEDIAL MENISCECTOMY
Anesthesia: General | Site: Knee | Laterality: Right

## 2018-03-24 MED ORDER — HYDROMORPHONE HCL 1 MG/ML IJ SOLN
INTRAMUSCULAR | Status: AC
  Filled 2018-03-24: qty 0.5

## 2018-03-24 MED ORDER — EPINEPHRINE 30 MG/30ML IJ SOLN
INTRAMUSCULAR | Status: AC
  Filled 2018-03-24: qty 1

## 2018-03-24 MED ORDER — OXYCODONE HCL 5 MG PO TABS
ORAL_TABLET | ORAL | Status: AC
  Filled 2018-03-24: qty 1

## 2018-03-24 MED ORDER — MIDAZOLAM HCL 2 MG/2ML IJ SOLN
1.0000 mg | INTRAMUSCULAR | Status: DC | PRN
  Administered 2018-03-24: 2 mg via INTRAVENOUS

## 2018-03-24 MED ORDER — MIDAZOLAM HCL 2 MG/2ML IJ SOLN
INTRAMUSCULAR | Status: AC
  Filled 2018-03-24: qty 2

## 2018-03-24 MED ORDER — BUPIVACAINE-EPINEPHRINE 0.5% -1:200000 IJ SOLN
INTRAMUSCULAR | Status: DC | PRN
  Administered 2018-03-24: 20 mL

## 2018-03-24 MED ORDER — HYDROMORPHONE HCL 1 MG/ML IJ SOLN
0.2500 mg | INTRAMUSCULAR | Status: DC | PRN
  Administered 2018-03-24 (×2): 0.5 mg via INTRAVENOUS

## 2018-03-24 MED ORDER — LACTATED RINGERS IV SOLN: INTRAVENOUS | Status: DC

## 2018-03-24 MED ORDER — ONDANSETRON HCL 4 MG/2ML IJ SOLN
INTRAMUSCULAR | Status: AC
  Filled 2018-03-24: qty 2

## 2018-03-24 MED ORDER — HYDROCODONE-ACETAMINOPHEN 5-325 MG PO TABS: 1.0000 | ORAL_TABLET | ORAL | 0 refills | Status: AC | PRN

## 2018-03-24 MED ORDER — SCOPOLAMINE 1 MG/3DAYS TD PT72: 1.0000 | MEDICATED_PATCH | Freq: Once | TRANSDERMAL | Status: DC | PRN

## 2018-03-24 MED ORDER — LACTATED RINGERS IV SOLN
INTRAVENOUS | Status: DC
  Administered 2018-03-24: 12:00:00 via INTRAVENOUS

## 2018-03-24 MED ORDER — PROMETHAZINE HCL 25 MG/ML IJ SOLN: 6.2500 mg | INTRAMUSCULAR | Status: DC | PRN

## 2018-03-24 MED ORDER — DEXAMETHASONE SODIUM PHOSPHATE 10 MG/ML IJ SOLN
INTRAMUSCULAR | Status: AC
Start: 2018-03-24 — End: 2018-03-24
  Filled 2018-03-24: qty 1

## 2018-03-24 MED ORDER — DEXAMETHASONE SODIUM PHOSPHATE 4 MG/ML IJ SOLN
INTRAMUSCULAR | Status: DC | PRN
  Administered 2018-03-24: 10 mg via INTRAVENOUS

## 2018-03-24 MED ORDER — VANCOMYCIN HCL IN DEXTROSE 1-5 GM/200ML-% IV SOLN
1000.0000 mg | INTRAVENOUS | Status: AC
Start: 2018-03-24 — End: 2018-03-24
  Administered 2018-03-24: 1000 mg via INTRAVENOUS

## 2018-03-24 MED ORDER — FENTANYL CITRATE (PF) 100 MCG/2ML IJ SOLN
50.0000 ug | INTRAMUSCULAR | Status: DC | PRN
  Administered 2018-03-24: 100 ug via INTRAVENOUS

## 2018-03-24 MED ORDER — VANCOMYCIN HCL IN DEXTROSE 1-5 GM/200ML-% IV SOLN
1000.0000 mg | INTRAVENOUS | Status: AC
  Administered 2018-03-24: 1000 mg via INTRAVENOUS

## 2018-03-24 MED ORDER — LIDOCAINE HCL (CARDIAC) 20 MG/ML IV SOLN
INTRAVENOUS | Status: DC | PRN
  Administered 2018-03-24: 60 mg via INTRAVENOUS

## 2018-03-24 MED ORDER — CHLORHEXIDINE GLUCONATE 4 % EX LIQD: 60.0000 mL | Freq: Once | CUTANEOUS | Status: DC

## 2018-03-24 MED ORDER — SODIUM CHLORIDE 0.9 % IR SOLN
Status: DC | PRN
  Administered 2018-03-24: 12:00:00

## 2018-03-24 MED ORDER — MEPERIDINE HCL 25 MG/ML IJ SOLN: 6.2500 mg | INTRAMUSCULAR | Status: DC | PRN

## 2018-03-24 MED ORDER — OXYCODONE HCL 5 MG/5ML PO SOLN: 5.0000 mg | Freq: Once | ORAL | Status: AC | PRN

## 2018-03-24 MED ORDER — FENTANYL CITRATE (PF) 100 MCG/2ML IJ SOLN
INTRAMUSCULAR | Status: AC
  Filled 2018-03-24: qty 2

## 2018-03-24 MED ORDER — PROPOFOL 10 MG/ML IV BOLUS
INTRAVENOUS | Status: DC | PRN
  Administered 2018-03-24: 120 mg via INTRAVENOUS

## 2018-03-24 MED ORDER — OXYCODONE HCL 5 MG PO TABS
5.0000 mg | ORAL_TABLET | Freq: Once | ORAL | Status: AC | PRN
  Administered 2018-03-24: 5 mg via ORAL

## 2018-03-24 MED ORDER — VANCOMYCIN HCL IN DEXTROSE 1-5 GM/200ML-% IV SOLN
INTRAVENOUS | Status: AC
  Filled 2018-03-24: qty 200

## 2018-03-24 SURGICAL SUPPLY — 41 items
BANDAGE ACE 6X5 VEL STRL LF (GAUZE/BANDAGES/DRESSINGS) ×5 IMPLANT
BLADE 4.2CUDA (BLADE) IMPLANT
BLADE CUTTER GATOR 3.5 (BLADE) ×3 IMPLANT
BLADE GREAT WHITE 4.2 (BLADE) ×2 IMPLANT
BLADE GREAT WHITE 4.2MM (BLADE) ×1
BNDG COHESIVE 6X5 TAN STRL LF (GAUZE/BANDAGES/DRESSINGS) ×5 IMPLANT
DRAPE ARTHROSCOPY W/POUCH 90 (DRAPES) ×5 IMPLANT
DURAPREP 26ML APPLICATOR (WOUND CARE) ×5 IMPLANT
ELECT MENISCUS 165MM 90D (ELECTRODE) IMPLANT
ELECT REM PT RETURN 9FT ADLT (ELECTROSURGICAL)
ELECTRODE REM PT RTRN 9FT ADLT (ELECTROSURGICAL) IMPLANT
GAUZE SPONGE 4X4 12PLY STRL (GAUZE/BANDAGES/DRESSINGS) ×5 IMPLANT
GAUZE XEROFORM 1X8 LF (GAUZE/BANDAGES/DRESSINGS) ×5 IMPLANT
GLOVE BIO SURGEON STRL SZ7.5 (GLOVE) ×5 IMPLANT
GLOVE BIO SURGEON STRL SZ8.5 (GLOVE) ×5 IMPLANT
GLOVE BIOGEL PI IND STRL 7.0 (GLOVE) ×1 IMPLANT
GLOVE BIOGEL PI IND STRL 8 (GLOVE) ×3 IMPLANT
GLOVE BIOGEL PI IND STRL 9 (GLOVE) ×3 IMPLANT
GLOVE BIOGEL PI INDICATOR 7.0 (GLOVE) ×2
GLOVE BIOGEL PI INDICATOR 8 (GLOVE) ×2
GLOVE BIOGEL PI INDICATOR 9 (GLOVE) ×2
GLOVE ECLIPSE 6.5 STRL STRAW (GLOVE) ×3 IMPLANT
GOWN STRL REUS W/ TWL LRG LVL3 (GOWN DISPOSABLE) ×6 IMPLANT
GOWN STRL REUS W/TWL LRG LVL3 (GOWN DISPOSABLE) ×10
GOWN STRL REUS W/TWL XL LVL3 (GOWN DISPOSABLE) ×5 IMPLANT
IV NS IRRIG 3000ML ARTHROMATIC (IV SOLUTION) ×5 IMPLANT
KNEE WRAP E Z 3 GEL PACK (MISCELLANEOUS) ×5 IMPLANT
MANIFOLD NEPTUNE II (INSTRUMENTS) ×3 IMPLANT
NDL SAFETY ECLIPSE 18X1.5 (NEEDLE) ×3 IMPLANT
NEEDLE HYPO 18GX1.5 SHARP (NEEDLE) ×5
PACK ARTHROSCOPY DSU (CUSTOM PROCEDURE TRAY) ×5 IMPLANT
PACK BASIN DAY SURGERY FS (CUSTOM PROCEDURE TRAY) ×5 IMPLANT
PAD ALCOHOL SWAB (MISCELLANEOUS) ×5 IMPLANT
PENCIL BUTTON HOLSTER BLD 10FT (ELECTRODE) IMPLANT
PROBE BIPOLAR ATHRO 135MM 90D (MISCELLANEOUS) ×3 IMPLANT
SLEEVE SCD COMPRESS KNEE MED (MISCELLANEOUS) IMPLANT
SYR 3ML 18GX1 1/2 (SYRINGE) IMPLANT
SYR 5ML LL (SYRINGE) ×5 IMPLANT
TOWEL OR 17X24 6PK STRL BLUE (TOWEL DISPOSABLE) ×5 IMPLANT
TUBING ARTHRO INFLOW-ONLY STRL (TUBING) ×5 IMPLANT
WATER STERILE IRR 1000ML POUR (IV SOLUTION) ×5 IMPLANT

## 2018-03-24 NOTE — Discharge Instructions (Addendum)
Knee Arthroscopy, Care After °Refer to this sheet in the next few weeks. These instructions provide you with information about caring for yourself after your procedure. Your health care provider may also give you more specific instructions. Your treatment has been planned according to current medical practices, but problems sometimes occur. Call your health care provider if you have any problems or questions after your procedure. °What can I expect after the procedure? °After the procedure, it is common to have: °· Soreness. °· Pain. ° °Follow these instructions at home: °Bathing °· Do not take baths, swim, or use a hot tub until your health care provider approves. °Incision care °· There are many different ways to close and cover an incision, including stitches, skin glue, and adhesive strips. Follow your health care provider’s instructions about: °? Incision care. °? Bandage (dressing) changes and removal. °? Incision closure removal. °· Check your incision area every day for signs of infection. Watch for: °? Redness, swelling, or pain. °? Fluid, blood, or pus. °Activity °· Avoid strenuous activities for as long as directed by your health care provider. °· Return to your normal activities as directed by your health care provider. Ask your health care provider what activities are safe for you. °· Perform range-of-motion exercises only as directed by your health care provider. °· Do not lift anything that is heavier than 10 lb (4.5 kg). °· Do not drive or operate heavy machinery while taking pain medicine. °· If you were given crutches, use them as directed by your health care provider. °Managing pain, stiffness, and swelling °· If directed, apply ice to the injured area: °? Put ice in a plastic bag. °? Place a towel between your skin and the bag. °? Leave the ice on for 20 minutes, 2-3 times per day. °· Raise the injured area above the level of your heart while you are sitting or lying down as directed by your  health care provider. °General instructions °· Keep all follow-up visits as directed by your health care provider. This is important. °· Take medicines only as directed by your health care provider. °· Do not use any tobacco products, including cigarettes, chewing tobacco, or electronic cigarettes. If you need help quitting, ask your health care provider. °· If you were given compression stockings, wear them as directed by your health care provider. These stockings help prevent blood clots and reduce swelling in your legs. °Contact a health care provider if: °· You have severe pain with any movement of your knee. °· You notice a bad smell coming from the incision or dressing. °· You have redness, swelling, or pain at the site of your incision. °· You have fluid, blood, or pus coming from your incision. °Get help right away if: °· You develop a rash. °· You have a fever. °· You have difficulty breathing or have shortness of breath. °· You develop pain in your calves or in the back of your knee. °· You develop chest pain. °· You develop numbness or tingling in your leg or foot. °This information is not intended to replace advice given to you by your health care provider. Make sure you discuss any questions you have with your health care provider. °Document Released: 07/04/2005 Document Revised: 05/16/2016 Document Reviewed: 12/11/2014 °Elsevier Interactive Patient Education © 2018 [employer] ° ° ° ° ° ° ° ° ° ° °Post Anesthesia Home Care Instructions ° °Activity: °Get plenty of rest for the remainder of the day. A responsible individual must stay with   you for 24 hours following the procedure.  °For the next 24 hours, DO NOT: °-Drive a car °-Operate machinery °-Drink alcoholic beverages °-Take any medication unless instructed by your physician °-Make any legal decisions or sign important papers. ° °Meals: °Start with liquid foods such as gelatin or soup. Progress to regular foods as tolerated. Avoid greasy, spicy,  heavy foods. If nausea and/or vomiting occur, drink only clear liquids until the nausea and/or vomiting subsides. Call your physician if vomiting continues. ° °Special Instructions/Symptoms: °Your throat may feel dry or sore from the anesthesia or the breathing tube placed in your throat during surgery. If this causes discomfort, gargle with warm salt water. The discomfort should disappear within 24 hours. ° °If you had a scopolamine patch placed behind your ear for the management of post- operative nausea and/or vomiting: ° °1. The medication in the patch is effective for 72 hours, after which it should be removed.  Wrap patch in a tissue and discard in the trash. Wash hands thoroughly with soap and water. °2. You may remove the patch earlier than 72 hours if you experience unpleasant side effects which may include dry mouth, dizziness or visual disturbances. °3. Avoid touching the patch. Wash your hands with soap and water after contact with the patch. °  ° °

## 2018-03-24 NOTE — Op Note (Signed)
Pre-Op Dx: Right knee medial and lateral meniscal tears with chondromalacia  Postop Dx: Same  Procedure: Right knee arthroscopic partial medial lateral meniscectomies posterior horn complex tears to both.  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  Surgeon: Feliberto GottronFrank J. Turner Danielsowan M.D.  Assist: Tomi LikensEric K. Gaylene BrooksPhillips PA-C  (present throughout entire procedure and necessary for timely completion of the procedure) Anes: General LMA  EBL: Minimal  Fluids: 800 cc   Indications: Catching popping and pain in the right knee for many months.  MRI scan showed medial and lateral meniscal tears.. Pt has failed conservative treatment with anti-inflammatory medicines, physical therapy, and modified activites but did get good temporarily from an intra-articular cortisone injection. Pain has recurred and patient desires elective arthroscopic evaluation and treatment of knee. Risks and benefits of surgery have been discussed and questions answered.  Procedure: Patient identified by arm band and taken to the operating room at the day surgery Center. The appropriate anesthetic monitors were attached, and General LMA anesthesia was induced without difficulty. Lateral post was applied to the table and the lower extremity was prepped and draped in usual sterile fashion from the ankle to the midthigh. Time out procedure was performed. We began the operation by making standard inferior lateral and inferior medial peripatellar portals with a #11 blade allowing introduction of the arthroscope through the inferior lateral portal and the out flow to the inferior medial portal. Pump pressure was set at 100 mmHg and diagnostic arthroscopy  revealed minimal chondromalacia to the patellofemoral joint moving into the medial compartment we identified a posterior horn medial meniscal tear that went all the way around the mid medial and was flipped up into the gutter this is debrided back to a stable margin with a straight  biter of 4.2 gray-white sucker shaver and a 3.5 Gator sucker shaver we also used an upbiter.  Moving of the notch the ACL was noted to be absent moving of the lateral compartment complex tearing of the lateral meniscus from the root all the way around the mid lateral requiring debridement of the inner one half of the lateral meniscus we then probed the remnant and found to be stable.  Lateral tibial plateau grade III chondromalacia with flap tears again debrided back to a stable margin with a 3.5 mm Gator sucker shaver.. The knee was irrigated out normal saline solution. A dressing of xerofoam 4 x 4 dressing sponges, web roll and an Ace wrap was applied. The patient was awakened extubated and taken to the recovery without difficulty.    Signed: Nestor LewandowskyFrank J Wilmot Quevedo, MD

## 2018-03-24 NOTE — Anesthesia Preprocedure Evaluation (Signed)
Anesthesia Evaluation  Patient identified by MRN, date of birth, ID band Patient awake    Reviewed: Allergy & Precautions, NPO status , Patient's Chart, lab work & pertinent test results  Airway Mallampati: II  TM Distance: >3 FB Neck ROM: Full    Dental no notable dental hx.    Pulmonary neg pulmonary ROS, Current Smoker,    Pulmonary exam normal breath sounds clear to auscultation       Cardiovascular negative cardio ROS Normal cardiovascular exam Rhythm:Regular Rate:Normal     Neuro/Psych  Headaches, Anxiety Bipolar Disorder negative neurological ROS  negative psych ROS   GI/Hepatic negative GI ROS, Neg liver ROS, GERD  ,  Endo/Other  negative endocrine ROS  Renal/GU negative Renal ROS  negative genitourinary   Musculoskeletal negative musculoskeletal ROS (+)   Abdominal   Peds negative pediatric ROS (+)  Hematology negative hematology ROS (+)   Anesthesia Other Findings   Reproductive/Obstetrics negative OB ROS                             Anesthesia Physical Anesthesia Plan  ASA: II  Anesthesia Plan: General   Post-op Pain Management:    Induction: Intravenous  PONV Risk Score and Plan: 2 and Ondansetron and Midazolam  Airway Management Planned: LMA  Additional Equipment:   Intra-op Plan:   Post-operative Plan: Extubation in OR  Informed Consent: I have reviewed the patients History and Physical, chart, labs and discussed the procedure including the risks, benefits and alternatives for the proposed anesthesia with the patient or authorized representative who has indicated his/her understanding and acceptance.   Dental advisory given  Plan Discussed with: CRNA  Anesthesia Plan Comments:         Anesthesia Quick Evaluation

## 2018-03-24 NOTE — Interval H&P Note (Signed)
History and Physical Interval Note:  03/24/2018 11:57 AM  Kimberly FramesLisa E Hyland  has presented today for surgery, with the diagnosis of RIGHT MEDIAL AND LATERAL MENISCUS TEARS  The various methods of treatment have been discussed with the patient and family. After consideration of risks, benefits and other options for treatment, the patient has consented to  Procedure(s): ARTHROSCOPY KNEE (Right) as a surgical intervention .  The patient's history has been reviewed, patient examined, no change in status, stable for surgery.  I have reviewed the patient's chart and labs.  Questions were answered to the patient's satisfaction.     Nestor LewandowskyFrank J Kruze Atchley

## 2018-03-24 NOTE — Transfer of Care (Signed)
Immediate Anesthesia Transfer of Care Note  Patient: Kimberly Henson  Procedure(s) Performed: KNEE ARTHROSCOPY WITH MEDIAL MENISECTOMY (Right Knee) KNEE ARTHROSCOPY WITH LATERAL MENISECTOMY (Knee) CHONDROPLASTY (Knee)  Patient Location: PACU  Anesthesia Type:General  Level of Consciousness: sedated  Airway & Oxygen Therapy: Patient Spontanous Breathing and Patient connected to face mask oxygen  Post-op Assessment: Report given to RN and Post -op Vital signs reviewed and stable  Post vital signs: Reviewed and stable  Last Vitals:  Vitals Value Taken Time  BP 108/77 03/24/2018 12:54 PM  Temp    Pulse 65 03/24/2018 12:57 PM  Resp 12 03/24/2018 12:57 PM  SpO2 100 % 03/24/2018 12:57 PM  Vitals shown include unvalidated device data.  Last Pain:  Vitals:   03/24/18 1254  TempSrc:   PainSc: (P) Asleep      Patients Stated Pain Goal: 4 (03/24/18 1132)  Complications: No apparent anesthesia complications

## 2018-03-24 NOTE — Anesthesia Procedure Notes (Signed)
Procedure Name: LMA Insertion Date/Time: 03/24/2018 12:11 PM Performed by: Burna Cashonrad, Vesna Kable C, CRNA Pre-anesthesia Checklist: Patient identified, Emergency Drugs available, Suction available and Patient being monitored Patient Re-evaluated:Patient Re-evaluated prior to induction Oxygen Delivery Method: Circle system utilized Preoxygenation: Pre-oxygenation with 100% oxygen Induction Type: IV induction Ventilation: Mask ventilation without difficulty LMA: LMA inserted LMA Size: 4.0 Number of attempts: 1 Airway Equipment and Method: Bite block Placement Confirmation: positive ETCO2 Tube secured with: Tape Dental Injury: Teeth and Oropharynx as per pre-operative assessment

## 2018-03-24 NOTE — Anesthesia Postprocedure Evaluation (Signed)
Anesthesia Post Note  Patient: Kimberly Henson  Procedure(s) Performed: KNEE ARTHROSCOPY WITH MEDIAL MENISECTOMY (Right Knee) KNEE ARTHROSCOPY WITH LATERAL MENISECTOMY (Knee) CHONDROPLASTY (Knee)     Patient location during evaluation: PACU Anesthesia Type: General Level of consciousness: awake and alert Pain management: pain level controlled Vital Signs Assessment: post-procedure vital signs reviewed and stable Respiratory status: spontaneous breathing, nonlabored ventilation and respiratory function stable Cardiovascular status: blood pressure returned to baseline and stable Postop Assessment: no apparent nausea or vomiting Anesthetic complications: no    Last Vitals:  Vitals:   03/24/18 1330 03/24/18 1407  BP: 130/78 (!) 155/80  Pulse: 69 76  Resp: (!) 8 18  Temp:  36.7 C  SpO2: 96% 93%    Last Pain:  Vitals:   03/24/18 1407  TempSrc:   PainSc: 0-No pain                 Lowella CurbWarren Ray Jarrette Dehner

## 2018-03-25 ENCOUNTER — Encounter (HOSPITAL_BASED_OUTPATIENT_CLINIC_OR_DEPARTMENT_OTHER): Payer: Self-pay | Admitting: Orthopedic Surgery

## 2018-04-01 ENCOUNTER — Ambulatory Visit: Payer: Medicaid Other | Admitting: Podiatry

## 2018-04-01 ENCOUNTER — Ambulatory Visit (INDEPENDENT_AMBULATORY_CARE_PROVIDER_SITE_OTHER): Payer: Medicaid Other

## 2018-04-01 ENCOUNTER — Encounter: Payer: Self-pay | Admitting: Podiatry

## 2018-04-01 DIAGNOSIS — M21612 Bunion of left foot: Secondary | ICD-10-CM

## 2018-04-01 DIAGNOSIS — M21962 Unspecified acquired deformity of left lower leg: Secondary | ICD-10-CM

## 2018-04-01 DIAGNOSIS — M7752 Other enthesopathy of left foot: Secondary | ICD-10-CM | POA: Diagnosis not present

## 2018-04-01 DIAGNOSIS — M216X2 Other acquired deformities of left foot: Secondary | ICD-10-CM

## 2018-04-01 DIAGNOSIS — M2012 Hallux valgus (acquired), left foot: Secondary | ICD-10-CM

## 2018-04-01 DIAGNOSIS — M779 Enthesopathy, unspecified: Secondary | ICD-10-CM

## 2018-04-01 MED ORDER — MELOXICAM 15 MG PO TABS: 15.0000 mg | ORAL_TABLET | Freq: Every day | ORAL | 1 refills | Status: AC

## 2018-04-01 NOTE — Patient Instructions (Signed)
Pre-Operative Instructions  Congratulations, you have decided to take an important step towards improving your quality of life.  You can be assured that the doctors and staff at [facility] will be with you every step of the way.  Here are some important things you should know:  1. Plan to be at the surgery center/hospital at least 1 (one) hour prior to your scheduled time, unless otherwise directed by the surgical center/hospital staff.  You must have a responsible adult accompany you, remain during the surgery and drive you home.  Make sure you have directions to the surgical center/hospital to ensure you arrive on time. 2. If you are having surgery at [facility] or [facility] hospitals, you will need a copy of your medical history and physical form from your family physician within one month prior to the date of surgery. We will give you a form for your primary physician to complete.  3. We make every effort to accommodate the date you request for surgery.  However, there are times where surgery dates or times have to be moved.  We will contact you as soon as possible if a change in schedule is required.   4. No aspirin/ibuprofen for one week before surgery.  If you are on aspirin, any non-steroidal anti-inflammatory medications (Mobic, Aleve, Ibuprofen) should not be taken seven (7) days prior to your surgery.  You make take Tylenol for pain prior to surgery.  5. Medications - If you are taking daily heart and blood pressure medications, seizure, reflux, allergy, asthma, anxiety, pain or diabetes medications, make sure you notify the surgery center/hospital before the day of surgery so they can tell you which medications you should take or avoid the day of surgery. 6. No food or drink after midnight the night before surgery unless directed otherwise by surgical center/hospital staff. 7. No alcoholic beverages 24-hours prior to surgery.  No smoking 24-hours prior or 24-hours after  surgery. 8. Wear loose pants or shorts. They should be loose enough to fit over bandages, boots, and casts. 9. Don't wear slip-on shoes. Sneakers are preferred. 10. Bring your boot with you to the surgery center/hospital.  Also bring crutches or a walker if your physician has prescribed it for you.  If you do not have this equipment, it will be provided for you after surgery. 11. If you have not been contacted by the surgery center/hospital by the day before your surgery, call to confirm the date and time of your surgery. 12. Leave-time from work may vary depending on the type of surgery you have.  Appropriate arrangements should be made prior to surgery with your employer. 13. Prescriptions will be provided immediately following surgery by your doctor.  Fill these as soon as possible after surgery and take the medication as directed. Pain medications will not be refilled on weekends and must be approved by the doctor. 14. Remove nail polish on the operative foot and avoid getting pedicures prior to surgery. 15. Wash the night before surgery.  The night before surgery wash the foot and leg well with water and the antibacterial soap provided. Be sure to pay special attention to beneath the toenails and in between the toes.  Wash for at least three (3) minutes. Rinse thoroughly with water and dry well with a towel.  Perform this wash unless told not to do so by your physician.  Enclosed: 1 Ice pack (please put in freezer the night before surgery)   1 Hibiclens skin cleaner     Pre-op instructions  If you have any questions regarding the instructions, please do not hesitate to call our office.  [place]: [address], [place], [place] [address] -- [phone]  [place]: [address] [place], [place] [address] -- [phone]  [place]: [address]  [place], [place] [address] -- [phone]  [place]: [address], [address], [place], [place] [address] -- [phone]  Website:  [email or web address] 

## 2018-04-27 NOTE — Progress Notes (Signed)
Subjective:  Patient ID: Kimberly Henson, female    DOB: 03-Oct-1961,  MRN: [id number]  Chief Complaint  Patient presents with  . Bunions    left foot   57 y.o. female presents with the above complaint.  Reports painful bunion to the left foot.  Present for several years.  States that she is having pain with all shoe gear.  Denies prior treatments.  Past Medical History:  Diagnosis Date  . Bipolar 1 disorder [facility])    Past Surgical History:  Procedure Laterality Date  . CHONDROPLASTY  03/24/2018   Procedure: CHONDROPLASTY;  Surgeon: Gean Birchwood, MD;  Location: [facility];  Service: Orthopedics;;  . ECTOPIC PREGNANCY SURGERY    . KNEE ARTHROSCOPY WITH LATERAL MENISECTOMY  03/24/2018   Procedure: KNEE ARTHROSCOPY WITH LATERAL MENISECTOMY;  Surgeon: Gean Birchwood, MD;  Location: [facility];  Service: Orthopedics;;  . KNEE ARTHROSCOPY WITH MEDIAL MENISECTOMY Right 03/24/2018   Procedure: KNEE ARTHROSCOPY WITH MEDIAL MENISECTOMY;  Surgeon: Gean Birchwood, MD;  Location: [facility];  Service: Orthopedics;  Laterality: Right;    Current Outpatient Medications:  .  clonazePAM (KLONOPIN) 0.5 MG tablet, TK 1 T PO BID PRF PANIC ATTACKS / INSOMNIA, Disp: , Rfl: 1 .  HYDROcodone-acetaminophen (NORCO) 5-325 MG tablet, Take 1 tablet by mouth every 4 (four) hours as needed., Disp: 30 tablet, Rfl: 0 .  lurasidone (LATUDA) 40 MG TABS tablet, Take 40 mg by mouth daily with breakfast., Disp: , Rfl:  .  meloxicam (MOBIC) 15 MG tablet, Take 1 tablet (15 mg total) by mouth daily., Disp: 30 tablet, Rfl: 1 .  OXcarbazepine (TRILEPTAL) 150 MG tablet, TK 1 T PO BID FOR MOOD, Disp: , Rfl: 1 .  Oxcarbazepine (TRILEPTAL) 300 MG tablet, TK 1 T PO BID FOR MOOD, Disp: , Rfl: 1 .  QUEtiapine (SEROQUEL) 50 MG tablet, TK 1 T PO HS FOR MOOD OR SLP, Disp: , Rfl: 1 .  rOPINIRole (REQUIP) 0.25 MG tablet, TK 1 T PO HS FOR 1 WK THEN TK 2 TS PO HS FOR RESTLESSNESS, Disp: , Rfl: 1 .   traMADol (ULTRAM) 50 MG tablet, TK 1 T PO Q 4 TO 6 H PRN P, Disp: , Rfl: 0  Allergies  Allergen Reactions  . Penicillins Itching   Review of Systems: Negative except as noted in the HPI. Denies N/V/F/Ch. Objective:  There were no vitals filed for this visit. General AA&O x3. Normal mood and affect.  Vascular Dorsalis pedis and posterior tibial pulses  present 2+ bilaterally  Capillary refill normal to all digits. Pedal hair growth normal.  Neurologic Epicritic sensation grossly present.  Dermatologic No open lesions. Interspaces clear of maceration. Nails well groomed and normal in appearance.  Orthopedic: MMT 5/5 in dorsiflexion, plantarflexion, inversion, and eversion. Normal joint ROM without pain or crepitus. 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  HAV deformity left   -X-rays taken and reviewed. [place]ate HAV deformity.  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  Would need at least osteotomies to correct the angulation also potentially shortening osteotomies.  All risk benefits alternatives of surgery explained to patient no guarantees given.  Patient would like to proceed.  Consent reviewed and signed by patient.  Return in about 1 week (around 04/08/2018) for post op bunion repair left.

## 2018-07-08 IMAGING — DX DG KNEE COMPLETE 4+V*R*
4 series · 4 of 4 positions shown · non-contrast
Comparison: None.

CLINICAL DATA: Right knee pain and swelling for 6 months. No known
injury.

EXAM:
RIGHT KNEE - COMPLETE 4+ VIEW

[knee ap]
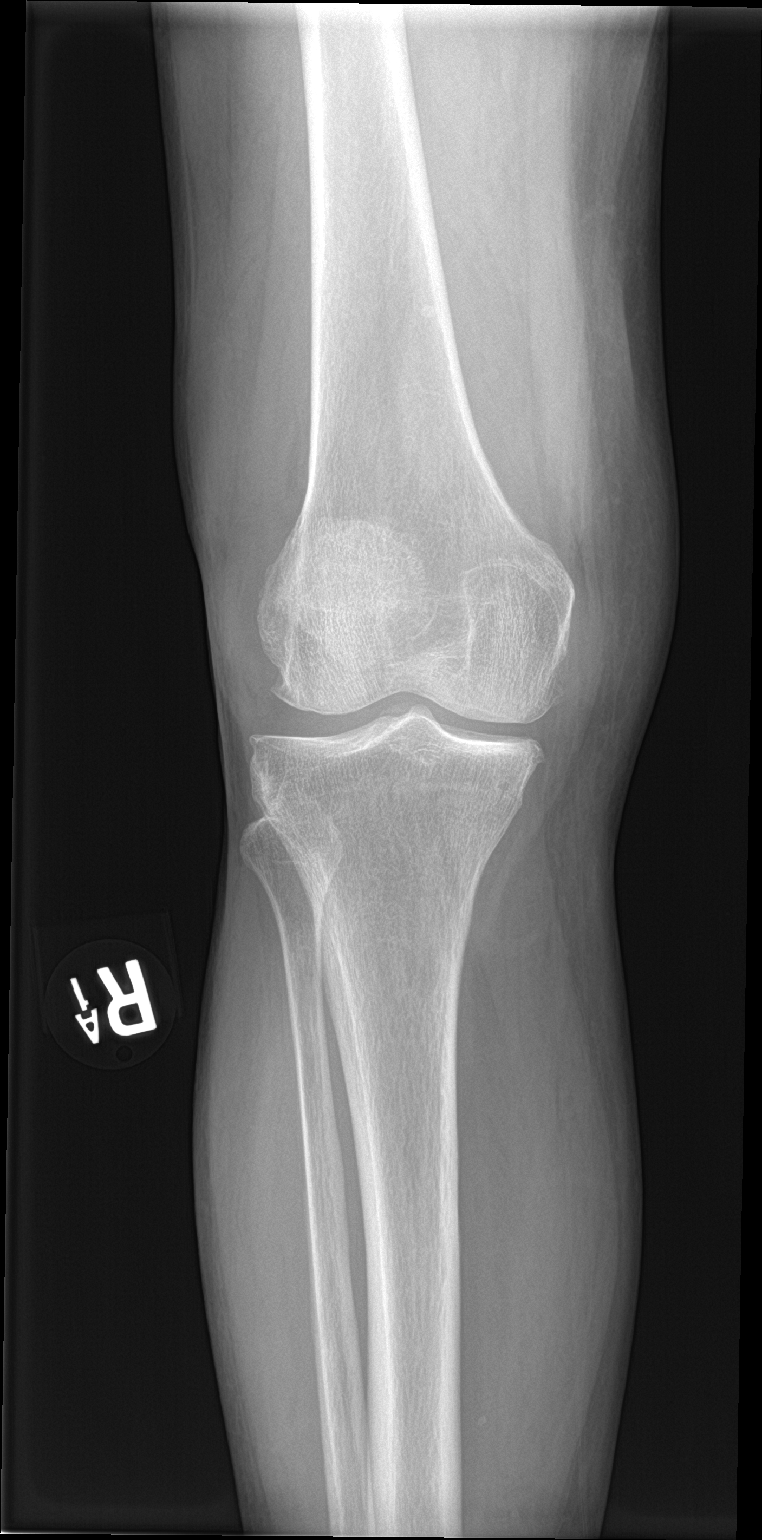

[knee lat]
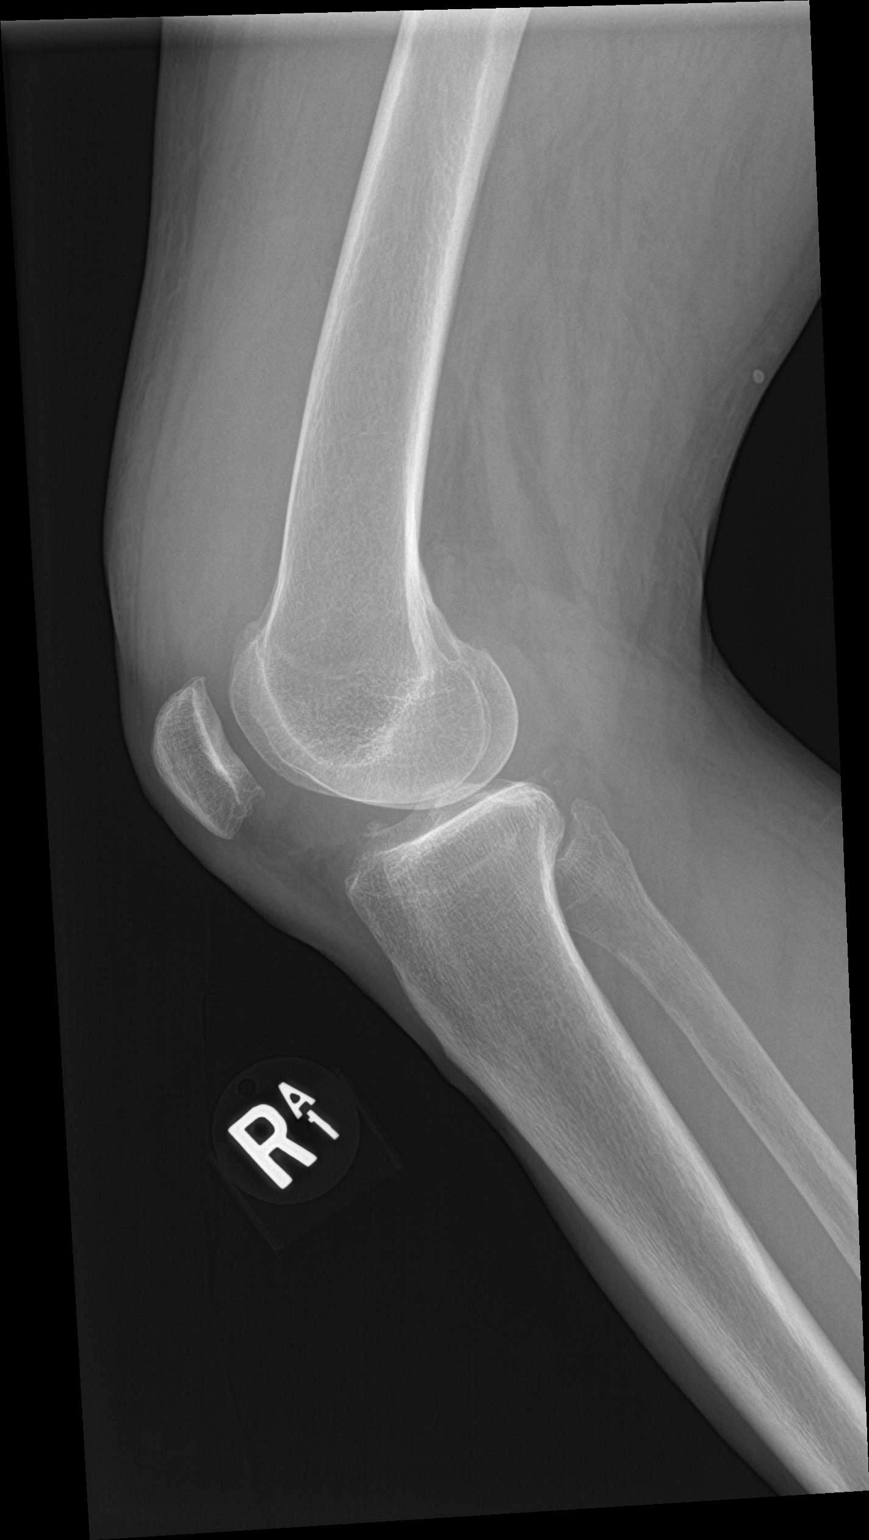

[knee obl (1 of 2)]
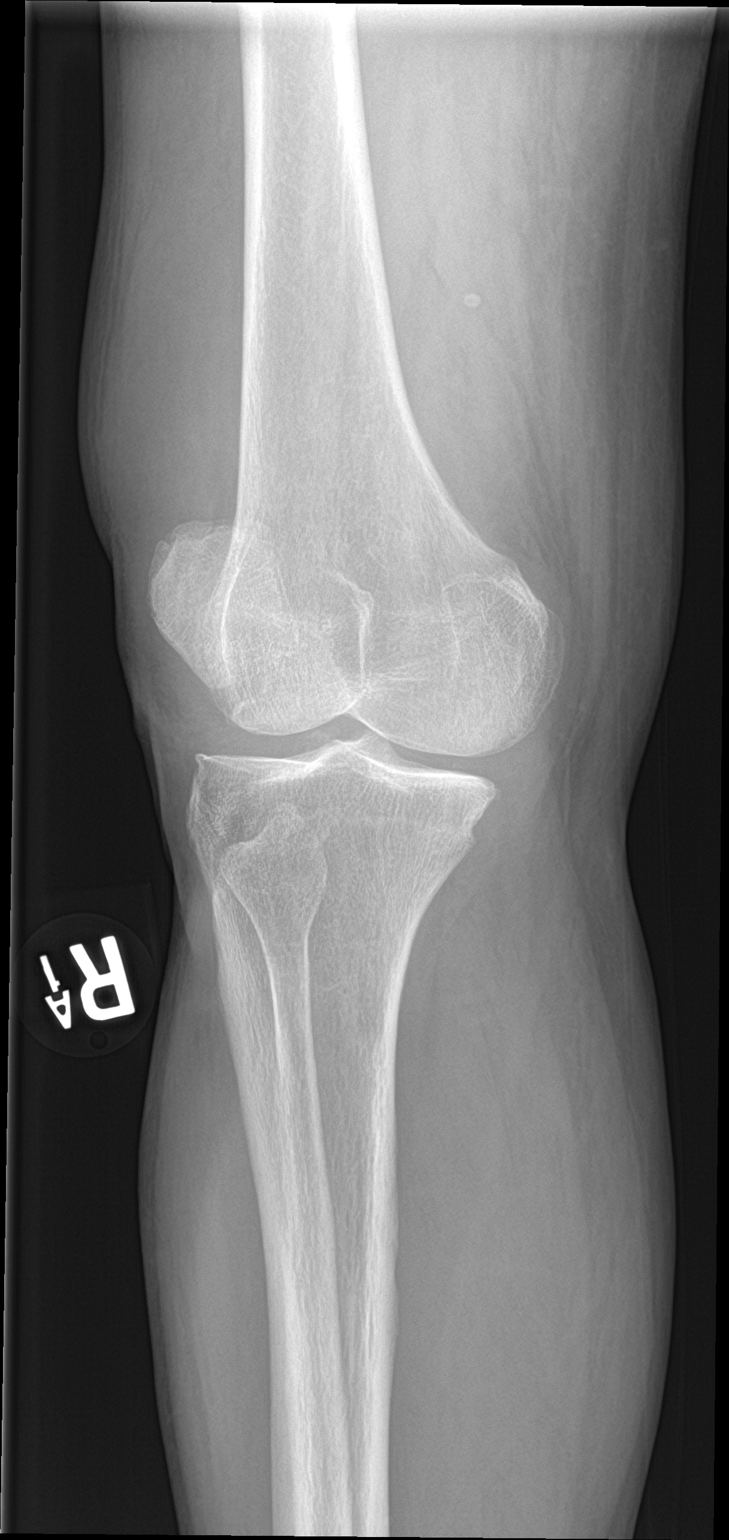

[knee obl (2 of 2)]
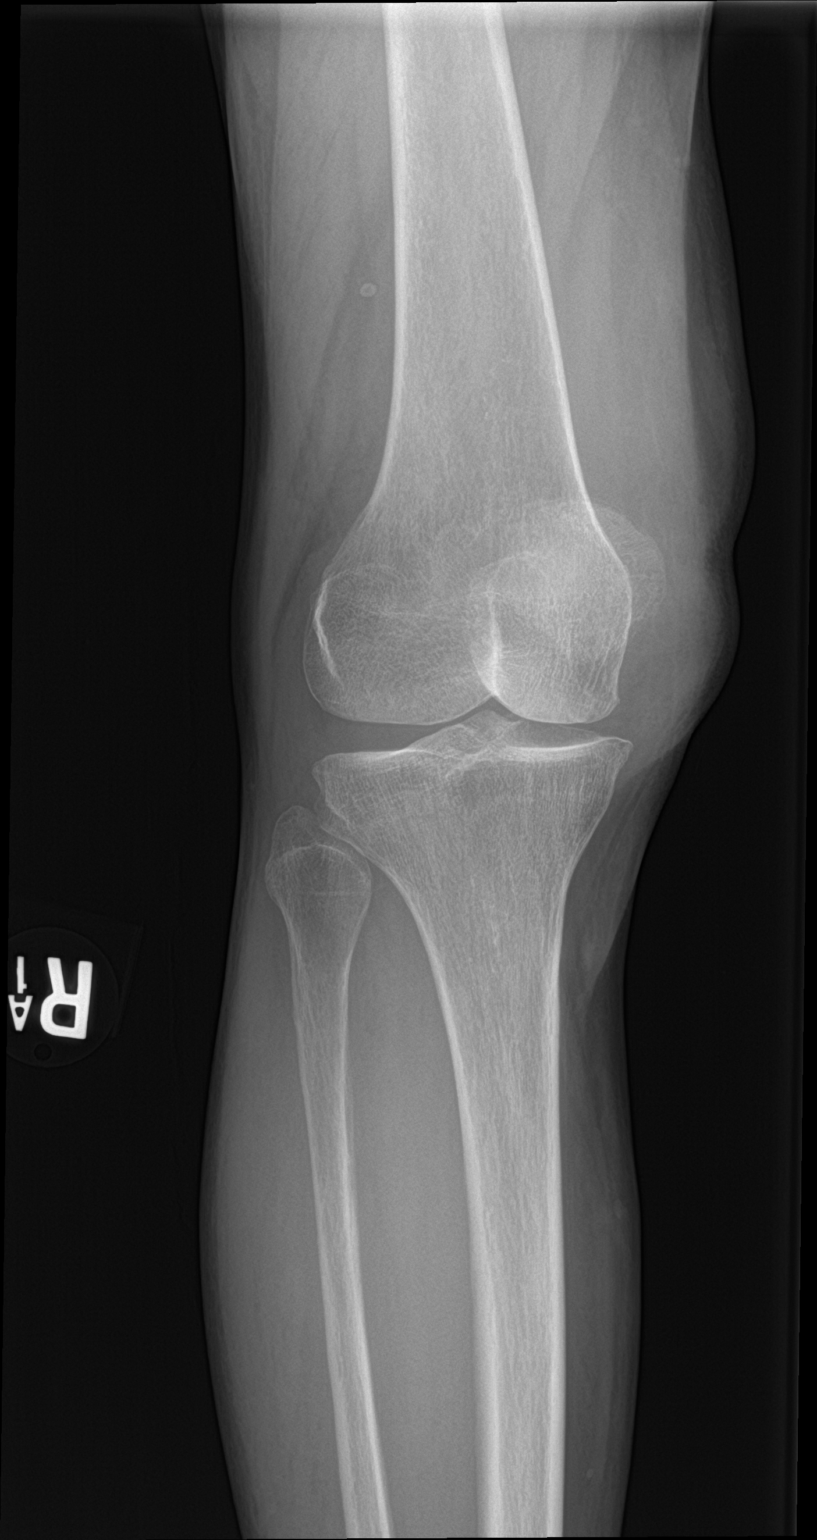

[4 of 4 positions shown; findings below may reference images not displayed]

FINDINGS: The patient has a large joint effusion. No acute bony or joint
abnormality is identified. Joint spaces are preserved with mild
osteophytosis seen about the knee.
IMPRESSION: Large joint effusion without underlying acute bony or joint
abnormality.

Mild osteoarthritis.
